# Patient Record
Sex: Female | Born: 1975
Health system: Southern US, Community
[De-identification: ages and names within clinical notes are randomized; demographics above are authoritative.]

## PROBLEM LIST (undated history)

## (undated) DIAGNOSIS — E079 Disorder of thyroid, unspecified: Secondary | ICD-10-CM

## (undated) DIAGNOSIS — L719 Rosacea, unspecified: Secondary | ICD-10-CM

## (undated) DIAGNOSIS — Z8619 Personal history of other infectious and parasitic diseases: Secondary | ICD-10-CM

## (undated) DIAGNOSIS — E785 Hyperlipidemia, unspecified: Secondary | ICD-10-CM

## (undated) DIAGNOSIS — O24419 Gestational diabetes mellitus in pregnancy, unspecified control: Secondary | ICD-10-CM

## (undated) DIAGNOSIS — R87629 Unspecified abnormal cytological findings in specimens from vagina: Secondary | ICD-10-CM

## (undated) DIAGNOSIS — E039 Hypothyroidism, unspecified: Secondary | ICD-10-CM

## (undated) HISTORY — PX: WISDOM TOOTH EXTRACTION: SHX21

## (undated) HISTORY — DX: Gestational diabetes mellitus in pregnancy, unspecified control: O24.419

## (undated) HISTORY — DX: Hypothyroidism, unspecified: E03.9

## (undated) HISTORY — DX: Disorder of thyroid, unspecified: E07.9

## (undated) HISTORY — DX: Hyperlipidemia, unspecified: E78.5

## (undated) HISTORY — DX: Unspecified abnormal cytological findings in specimens from vagina: R87.629

## (undated) HISTORY — DX: Personal history of other infectious and parasitic diseases: Z86.19

## (undated) HISTORY — DX: Rosacea, unspecified: L71.9

---

## 2012-01-06 ENCOUNTER — Ambulatory Visit (INDEPENDENT_AMBULATORY_CARE_PROVIDER_SITE_OTHER): Payer: BC Managed Care – PPO | Admitting: Family Medicine

## 2012-01-06 ENCOUNTER — Encounter: Payer: Self-pay | Admitting: Family Medicine

## 2012-01-06 VITALS — BP 122/90 | HR 82 | Resp 16 | Ht 70.0 in | Wt 264.0 lb

## 2012-01-06 DIAGNOSIS — L0292 Furuncle, unspecified: Secondary | ICD-10-CM

## 2012-01-06 DIAGNOSIS — E669 Obesity, unspecified: Secondary | ICD-10-CM

## 2012-01-06 DIAGNOSIS — L719 Rosacea, unspecified: Secondary | ICD-10-CM

## 2012-01-06 DIAGNOSIS — E039 Hypothyroidism, unspecified: Secondary | ICD-10-CM

## 2012-01-06 MED ORDER — SULFAMETHOXAZOLE-TRIMETHOPRIM 800-160 MG PO TABS: 1.0000 | ORAL_TABLET | Freq: Two times a day (BID) | ORAL | Status: AC

## 2012-01-06 MED ORDER — LEVOTHYROXINE SODIUM 150 MCG PO TABS: 150.0000 ug | ORAL_TABLET | Freq: Every day | ORAL | Status: DC

## 2012-01-06 MED ORDER — NORGESTIM-ETH ESTRAD TRIPHASIC 0.18/0.215/0.25 MG-35 MCG PO TABS: 1.0000 | ORAL_TABLET | Freq: Every day | ORAL | Status: DC

## 2012-01-06 MED ORDER — AMOXICILLIN 500 MG PO CAPS: 500.0000 mg | ORAL_CAPSULE | Freq: Two times a day (BID) | ORAL | Status: DC

## 2012-01-06 NOTE — Patient Instructions (Addendum)
Continue current medications Hold Amoxicillin Start Bactrim twice a day for infection, warm compresses Get the labs done fasting  I will get records  F/U 4 weeks- give 8AM slot

## 2012-01-08 DIAGNOSIS — E039 Hypothyroidism, unspecified: Secondary | ICD-10-CM | POA: Insufficient documentation

## 2012-01-08 DIAGNOSIS — L0292 Furuncle, unspecified: Secondary | ICD-10-CM | POA: Insufficient documentation

## 2012-01-08 DIAGNOSIS — E669 Obesity, unspecified: Secondary | ICD-10-CM | POA: Insufficient documentation

## 2012-01-08 DIAGNOSIS — L719 Rosacea, unspecified: Secondary | ICD-10-CM | POA: Insufficient documentation

## 2012-01-08 NOTE — Assessment & Plan Note (Signed)
Treat with Bactrim, dial soap, try another form of hair removal since lesions have increased with shaving

## 2012-01-08 NOTE — Assessment & Plan Note (Signed)
She is to take synthroid first thing in the morning before other meds, labs to be done

## 2012-01-08 NOTE — Progress Notes (Signed)
  Subjective:    Patient ID: Charlotte Carter, female    DOB: 10/21/1975, 36 y.o.   MRN: [id number]  HPI Pt here to establish care, she moved to [place] a few months ago. Records to be obtained from previous PCP. Medications and history reviewed PAP Smear UTD Works as OT for [employer] Needs refill on birth control pill Tri-sprintec Has recurrent cysts on her mons pubis, she typicaly shaves has had these evaluated before, some leaking pus.  Taking amoxicillin for Rosacea Hypothyroidism- has been on thyroid replacement since childhood, unaware of specific disease   Review of Systems  GEN- denies fatigue, fever, weight loss,weakness, recent illness HEENT- denies eye drainage, change in vision, nasal discharge, CVS- denies chest pain, palpitations RESP- denies SOB, cough, wheeze ABD- denies N/V, change in stools, abd pain GU- denies dysuria, hematuria, dribbling, incontinence MSK- denies joint pain, muscle aches, injury Neuro- denies headache, dizziness, syncope, seizure activity      Objective:   Physical Exam GEN- NAD, alert and oriented x3, obese HEENT- PERRL, EOMI, non injected sclera, pink conjunctiva, MMM, oropharynx clear Neck- Supple,  CVS- RRR, no murmur RESP-CTAB ABD-NABS,soft,NT,ND EXT- No edema Pulses- Radial, DP- 2+ Skin- erythema to chest wall and face, neck, small boils and ingrown hairs on Mons Pubis, +induration, few open lesions with mild drainage       Assessment & Plan:

## 2012-01-08 NOTE — Assessment & Plan Note (Signed)
SHe is on amox for rosacea, will stop while on bactrim, she has not seen much benefit from chronic antibiotic use, will discuss possibly stopping

## 2012-01-08 NOTE — Assessment & Plan Note (Signed)
Check FLP 

## 2012-01-28 LAB — LIPID PANEL
Cholesterol: 171 mg/dL (ref 0–200)
HDL: 28 mg/dL — ABNORMAL LOW (ref >39)
LDL Cholesterol: 105 mg/dL — ABNORMAL HIGH (ref 0–99)
Triglycerides: 191 mg/dL — ABNORMAL HIGH (ref <150)

## 2012-01-28 LAB — COMPREHENSIVE METABOLIC PANEL
AST: 27 U/L (ref 0–37)
Alkaline Phosphatase: 40 U/L (ref 39–117)
BUN: 9 mg/dL (ref 6–23)
Calcium: 9.7 mg/dL (ref 8.4–10.5)
Creat: 0.58 mg/dL (ref 0.50–1.10)

## 2012-01-28 LAB — CBC
HCT: 42.6 % (ref 36.0–46.0)
Hemoglobin: 14.6 g/dL (ref 12.0–15.0)
MCH: 30.4 pg (ref 26.0–34.0)
MCHC: 34.3 g/dL (ref 30.0–36.0)
RBC: 4.8 MIL/uL (ref 3.87–5.11)

## 2012-01-29 LAB — T3, FREE: T3, Free: 3.3 pg/mL (ref 2.3–4.2)

## 2012-01-29 LAB — T4: T4, Total: 13.1 ug/dL — ABNORMAL HIGH (ref 5.0–12.5)

## 2012-02-03 ENCOUNTER — Ambulatory Visit (INDEPENDENT_AMBULATORY_CARE_PROVIDER_SITE_OTHER): Payer: BC Managed Care – PPO | Admitting: Family Medicine

## 2012-02-03 ENCOUNTER — Encounter: Payer: Self-pay | Admitting: Family Medicine

## 2012-02-03 ENCOUNTER — Other Ambulatory Visit (HOSPITAL_COMMUNITY)
Admission: RE | Admit: 2012-02-03 | Discharge: 2012-02-03 | Disposition: A | Discharge status: Home / Self Care | Payer: BC Managed Care – PPO | Source: Ambulatory Visit | Attending: Family Medicine | Admitting: Family Medicine

## 2012-02-03 VITALS — BP 120/88 | HR 87 | Resp 15 | Ht 70.0 in | Wt 262.1 lb

## 2012-02-03 DIAGNOSIS — L723 Sebaceous cyst: Secondary | ICD-10-CM

## 2012-02-03 DIAGNOSIS — Z1151 Encounter for screening for human papillomavirus (HPV): Secondary | ICD-10-CM | POA: Insufficient documentation

## 2012-02-03 DIAGNOSIS — Z01419 Encounter for gynecological examination (general) (routine) without abnormal findings: Secondary | ICD-10-CM

## 2012-02-03 DIAGNOSIS — Z124 Encounter for screening for malignant neoplasm of cervix: Secondary | ICD-10-CM

## 2012-02-03 DIAGNOSIS — L720 Epidermal cyst: Secondary | ICD-10-CM

## 2012-02-03 DIAGNOSIS — L0292 Furuncle, unspecified: Secondary | ICD-10-CM

## 2012-02-03 DIAGNOSIS — E785 Hyperlipidemia, unspecified: Secondary | ICD-10-CM

## 2012-02-03 DIAGNOSIS — R7301 Impaired fasting glucose: Secondary | ICD-10-CM | POA: Insufficient documentation

## 2012-02-03 MED ORDER — SULFAMETHOXAZOLE-TRIMETHOPRIM 800-160 MG PO TABS: 1.0000 | ORAL_TABLET | Freq: Two times a day (BID) | ORAL | Status: AC

## 2012-02-03 NOTE — Assessment & Plan Note (Signed)
Refer to general surgery for removal 

## 2012-02-03 NOTE — Progress Notes (Signed)
  Subjective:    Patient ID: Charlotte Carter, female    DOB: 05-16-1975, 36 y.o.   MRN: [id number]  HPI Pt here for GYN exam, Boils have improved but still present, one on her right panty line gets irritated and drains clear fluid at times but has improved a lot. Has a spot on her back for the past 3-4 years that has been growing in size and irritating her Had a knot on her right ankle that was swollen after dancing now improved.    Review of Systems  GEN- denies fatigue, fever, weight loss,weakness, recent illness HEENT- denies eye drainage, change in vision, nasal discharge, CVS- denies chest pain, palpitations RESP- denies SOB, cough, wheeze ABD- denies N/V, change in stools, abd pain GU- denies dysuria, hematuria, dribbling, incontinence MSK- denies joint pain, muscle aches, injury Neuro- denies headache, dizziness, syncope, seizure activity      Objective:   Physical Exam GEN- NAD, alert and oriented x3, obese HEENT- PERRL, EOMI, non injected sclera, pink conjunctiva, MMM, oropharynx clear CVS- RRR, no murmur RESP-CTAB Breast- normal symmetry, no nipple inversion,no nipple drainage, no nodules or lumps felt Nodes- no axillary nodes GU- normal external genitalia, vaginal mucosa pink and moist, cervix visualized no growth, no blood form os, minimal thin clear discharge, no CMT, no ovarian masses, uterus normal size, mons pubis small area of erythema, with mild induration, non fluctuant, small boil on right inner leg- improved, minimal clear drainage, no erythema, no induration, no fluctuance Back- 2cm cystic lesion in center of back , no erythema EXT- no nodule seen on right ankle, no swelling, normal ROM ankle joint, no edema       Assessment & Plan:   GYN exam- PAP Smear done, flu shot done already

## 2012-02-03 NOTE — Assessment & Plan Note (Signed)
Concern for DM with her recurrent infections and elevated CBG, obtain A1C

## 2012-02-03 NOTE — Assessment & Plan Note (Signed)
Lesions much improved today, the lesion in question on her pubis has no fluctuance or discrete abscess to I &D, as improved with bactrim will have her do another course, if they do not improve send to dermatology

## 2012-02-03 NOTE — Assessment & Plan Note (Signed)
Elevated TG, increase Fish oil to BID

## 2012-02-03 NOTE — Patient Instructions (Addendum)
Increase fish oil to 1 tablet twice a day Second course of bactrim Warm compresses Referral to general surgery for your cyst on back I recommend eye visit once a year I recommend dental visit every 6 months Goal is to  Exercise 30 minutes 5 days a week We will send a letter with lab results  F/U 6 months

## 2012-02-21 ENCOUNTER — Telehealth: Payer: Self-pay | Admitting: Family Medicine

## 2012-02-21 NOTE — Telephone Encounter (Signed)
Pt had CPE on 2nd visit

## 2012-02-21 NOTE — Telephone Encounter (Signed)
Error - disregard - message resent °

## 2012-02-21 NOTE — Telephone Encounter (Signed)
Error - disregard - message resent

## 2012-06-26 ENCOUNTER — Encounter: Payer: Self-pay | Admitting: Family Medicine

## 2012-06-26 ENCOUNTER — Ambulatory Visit (INDEPENDENT_AMBULATORY_CARE_PROVIDER_SITE_OTHER): Payer: BC Managed Care – PPO | Admitting: Family Medicine

## 2012-06-26 ENCOUNTER — Other Ambulatory Visit (HOSPITAL_COMMUNITY)
Admission: RE | Admit: 2012-06-26 | Discharge: 2012-06-26 | Disposition: A | Discharge status: Home / Self Care | Payer: BC Managed Care – PPO | Source: Ambulatory Visit | Attending: Family Medicine | Admitting: Family Medicine

## 2012-06-26 VITALS — BP 120/88 | HR 81 | Resp 16 | Wt 269.0 lb

## 2012-06-26 DIAGNOSIS — Z113 Encounter for screening for infections with a predominantly sexual mode of transmission: Secondary | ICD-10-CM | POA: Insufficient documentation

## 2012-06-26 DIAGNOSIS — N760 Acute vaginitis: Secondary | ICD-10-CM

## 2012-06-26 DIAGNOSIS — E669 Obesity, unspecified: Secondary | ICD-10-CM

## 2012-06-26 DIAGNOSIS — L720 Epidermal cyst: Secondary | ICD-10-CM

## 2012-06-26 DIAGNOSIS — L723 Sebaceous cyst: Secondary | ICD-10-CM

## 2012-06-26 DIAGNOSIS — K130 Diseases of lips: Secondary | ICD-10-CM

## 2012-06-26 MED ORDER — FLUCONAZOLE 150 MG PO TABS: 150.0000 mg | ORAL_TABLET | Freq: Once | ORAL | Status: DC

## 2012-06-26 MED ORDER — PHENTERMINE HCL 30 MG PO TBDP: 1.0000 | ORAL_TABLET | Freq: Every day | ORAL | Status: DC

## 2012-06-26 MED ORDER — CLOTRIMAZOLE-BETAMETHASONE 1-0.05 % EX CREA: TOPICAL_CREAM | CUTANEOUS | Status: DC

## 2012-06-26 NOTE — Patient Instructions (Addendum)
Take diflucan tablet Use fungal/steroid cream on external vaginal area and groin only Stop the neosporin Call if this does not improve Phentermine- if you start take 1/2 tablet daily, for 2 weeks then increase to 1 tablet  F/U 6 months

## 2012-06-26 NOTE — Progress Notes (Signed)
  Subjective:    Patient ID: Charlotte Carter, female    DOB: [birth year], 37 y.o.   MRN: [id number]  HPI  Patient here to follow chronic medical problems. She also has a rash in genital region for the past 2 weeks it is red and itchy in nature. She has been using over-the-counter been taking vaginal cream as well as Monistat she thought she had a yeast infection with some vaginal discharge and itching. She has systems on her back however her husband pop this for her because they could not afford the surgery. Having difficulty loosing weight, has tried dieting and changing habits, works out 30 minutes 3 days a week Review of Systems  GEN- denies fatigue, fever, weight loss,weakness, recent illness HEENT- denies eye drainage, change in vision, nasal discharge, CVS- denies chest pain, palpitations RESP- denies SOB, cough, wheeze ABD- denies N/V, change in stools, abd pain GU- denies dysuria, hematuria, dribbling, incontinence MSK- denies joint pain, muscle aches, injury Neuro- denies headache, dizziness, syncope, seizure activity      Objective:   Physical Exam GEN- NAD, alert and oriented x3, obese HEENT- PERRL, EOMI, non injected sclera, pink conjunctiva, MMM, oropharynx clear CVS- RRR, no murmur RESP-CTAB Breast- normal symmetry, no nipple inversion,no nipple drainage, no nodules or lumps felt Nodes- no axillary nodes GU- normal external genitalia, vaginal mucosa pink and moist, cervix visualized no growth, no blood form os, + discharge, no CMT, right groin erythema slight raised, no warmth, few small pustules scattered on buttocks, erythema extends to labia majora      Assessment & Plan:

## 2012-06-27 DIAGNOSIS — K130 Diseases of lips: Secondary | ICD-10-CM | POA: Insufficient documentation

## 2012-06-27 DIAGNOSIS — N760 Acute vaginitis: Secondary | ICD-10-CM | POA: Insufficient documentation

## 2012-06-27 NOTE — Assessment & Plan Note (Signed)
Appears to be inflamed candidal infection, will use lotrisone BID,

## 2012-06-27 NOTE — Assessment & Plan Note (Signed)
Pt let husband drain this, there is still a small pin-hole, no signs of infection, will likley re-occur

## 2012-06-27 NOTE — Assessment & Plan Note (Signed)
Cultures taken, diflucan given

## 2012-06-27 NOTE — Assessment & Plan Note (Signed)
Discussed proper nutrition, activity of 150 minutes per week, she will think about trying phentermine depending on if she can afford medication

## 2012-07-20 ENCOUNTER — Ambulatory Visit: Payer: BC Managed Care – PPO | Admitting: Family Medicine

## 2012-08-18 ENCOUNTER — Encounter: Payer: Self-pay | Admitting: Family Medicine

## 2012-08-20 ENCOUNTER — Telehealth: Payer: Self-pay | Admitting: Family Medicine

## 2012-08-20 NOTE — Telephone Encounter (Signed)
Please call patient pharmacy and get the doses of these acne medications she was and go ahead and refill  Retin A Metro-gel

## 2012-08-21 ENCOUNTER — Other Ambulatory Visit: Payer: Self-pay

## 2012-08-21 NOTE — Telephone Encounter (Signed)
Pt contacted to give [place] pharmacy information and dosing for her current meds as multiple doses for these creams

## 2012-08-23 ENCOUNTER — Other Ambulatory Visit: Payer: Self-pay | Admitting: Family Medicine

## 2012-08-23 MED ORDER — METRONIDAZOLE 0.75 % EX GEL: Freq: Every day | CUTANEOUS | Status: DC

## 2012-08-23 MED ORDER — TRETINOIN 0.025 % EX GEL: Freq: Every day | CUTANEOUS | Status: DC

## 2012-09-11 ENCOUNTER — Telehealth: Payer: Self-pay | Admitting: Family Medicine

## 2012-09-11 ENCOUNTER — Other Ambulatory Visit: Payer: Self-pay

## 2012-09-11 MED ORDER — TRETINOIN 0.025 % EX GEL: Freq: Every day | CUTANEOUS | Status: DC

## 2012-09-11 NOTE — Telephone Encounter (Signed)
Never received a PA request. Sent to [facility] in [place] and if needs a pa they will fax request

## 2012-09-12 ENCOUNTER — Encounter: Payer: Self-pay | Admitting: Family Medicine

## 2012-09-13 ENCOUNTER — Other Ambulatory Visit: Payer: Self-pay

## 2012-09-13 MED ORDER — NORGESTIM-ETH ESTRAD TRIPHASIC 0.18/0.215/0.25 MG-35 MCG PO TABS: 1.0000 | ORAL_TABLET | Freq: Every day | ORAL | Status: DC

## 2012-09-13 MED ORDER — LEVOTHYROXINE SODIUM 150 MCG PO TABS: 150.0000 ug | ORAL_TABLET | Freq: Every day | ORAL | Status: DC

## 2012-10-04 ENCOUNTER — Encounter: Payer: Self-pay | Admitting: Family Medicine

## 2012-11-09 ENCOUNTER — Ambulatory Visit (INDEPENDENT_AMBULATORY_CARE_PROVIDER_SITE_OTHER): Payer: BC Managed Care – PPO | Admitting: Family Medicine

## 2012-11-09 ENCOUNTER — Encounter: Payer: Self-pay | Admitting: Family Medicine

## 2012-11-09 VITALS — BP 128/98 | HR 76 | Temp 98.6°F | Resp 20 | Ht 69.0 in | Wt 267.0 lb

## 2012-11-09 DIAGNOSIS — J069 Acute upper respiratory infection, unspecified: Secondary | ICD-10-CM

## 2012-11-09 MED ORDER — AZITHROMYCIN 250 MG PO TABS: ORAL_TABLET | ORAL | Status: DC

## 2012-11-09 MED ORDER — GUAIFENESIN-CODEINE 100-10 MG/5ML PO SYRP: 10.0000 mL | ORAL_SOLUTION | Freq: Three times a day (TID) | ORAL | Status: DC | PRN

## 2012-11-09 MED ORDER — PHENTERMINE HCL 30 MG PO TBDP: 1.0000 | ORAL_TABLET | Freq: Every day | ORAL | Status: DC

## 2012-11-09 NOTE — Patient Instructions (Signed)
Take the antibiotics as prescribed Use the robitussin with codiene Gargle warm salt water or use throat lozenge F/U 3 months for weight- Fasting labs will be done that day

## 2012-11-11 DIAGNOSIS — J069 Acute upper respiratory infection, unspecified: Secondary | ICD-10-CM | POA: Insufficient documentation

## 2012-11-11 NOTE — Assessment & Plan Note (Signed)
With recent travel will treat with zpak, cough medication, fluids RTC if not improved, obtain CXR

## 2012-11-11 NOTE — Progress Notes (Signed)
  Subjective:    Patient ID: Charlotte Carter, female    DOB: 1975-08-01, 37 y.o.   MRN: [id number]  HPI  Pt here with mild productive cough, sore throat, nasal congestion  For the past week. [place] when symptoms started. Denies any fever, chills, N/V. She has been taking some OTC meds without relief. Continues to smoke but has cut back +sick contact    Review of Systems   GEN- + fatigue, denies fever, weight loss,weakness, recent illness HEENT- denies eye drainage, change in vision,+ nasal discharge, CVS- denies chest pain, palpitations RESP- denies SOB,+ cough, wheeze ABD- denies N/V, change in stools, abd pain Neuro- denies headache, dizziness, syncope, seizure activity      Objective:   Physical Exam  GEN- NAD, alert and oriented x3, +hoarse voice HEENT- PERRL, EOMI, non injected sclera, pink conjunctiva, MMM, oropharynx mild injection, TM clear bilat no effusion, no maxillary sinus tenderness, clear Nasal drainage  Neck- Supple, no LAD CVS- RRR, no murmur RESP-CTAB Pulses- Radial 2+         Assessment & Plan:

## 2012-11-21 ENCOUNTER — Encounter: Payer: Self-pay | Admitting: Family Medicine

## 2012-11-25 ENCOUNTER — Encounter: Payer: Self-pay | Admitting: Family Medicine

## 2012-11-26 ENCOUNTER — Other Ambulatory Visit: Payer: Self-pay | Admitting: Family Medicine

## 2012-11-26 MED ORDER — PHENTERMINE HCL 30 MG PO CAPS: 30.0000 mg | ORAL_CAPSULE | ORAL | Status: DC

## 2012-11-29 ENCOUNTER — Encounter: Payer: Self-pay | Admitting: Family Medicine

## 2012-12-05 ENCOUNTER — Other Ambulatory Visit: Payer: Self-pay | Admitting: Family Medicine

## 2012-12-05 ENCOUNTER — Encounter: Payer: Self-pay | Admitting: Family Medicine

## 2012-12-05 MED ORDER — PHENTERMINE HCL 37.5 MG PO TABS: 37.5000 mg | ORAL_TABLET | Freq: Every day | ORAL | Status: DC

## 2012-12-05 NOTE — Telephone Encounter (Signed)
Rx Refilled  

## 2013-02-08 ENCOUNTER — Ambulatory Visit: Payer: BC Managed Care – PPO | Admitting: Family Medicine

## 2013-02-15 ENCOUNTER — Ambulatory Visit: Payer: BC Managed Care – PPO | Admitting: Family Medicine

## 2013-02-18 ENCOUNTER — Encounter: Payer: Self-pay | Admitting: Family Medicine

## 2013-02-18 ENCOUNTER — Ambulatory Visit: Payer: BC Managed Care – PPO | Admitting: Family Medicine

## 2013-02-19 ENCOUNTER — Other Ambulatory Visit: Payer: BC Managed Care – PPO | Admitting: Family Medicine

## 2013-02-20 ENCOUNTER — Other Ambulatory Visit: Payer: Self-pay | Admitting: Family Medicine

## 2013-02-20 DIAGNOSIS — E039 Hypothyroidism, unspecified: Secondary | ICD-10-CM

## 2013-02-20 DIAGNOSIS — E785 Hyperlipidemia, unspecified: Secondary | ICD-10-CM

## 2013-02-20 DIAGNOSIS — R7301 Impaired fasting glucose: Secondary | ICD-10-CM

## 2013-02-21 ENCOUNTER — Other Ambulatory Visit: Payer: Self-pay

## 2013-02-21 ENCOUNTER — Other Ambulatory Visit: Payer: Self-pay | Admitting: Family Medicine

## 2013-02-22 ENCOUNTER — Telehealth: Payer: Self-pay | Admitting: Family Medicine

## 2013-02-22 DIAGNOSIS — L719 Rosacea, unspecified: Secondary | ICD-10-CM

## 2013-02-22 MED ORDER — AMOXICILLIN 500 MG PO CAPS: 500.0000 mg | ORAL_CAPSULE | Freq: Two times a day (BID) | ORAL | Status: DC

## 2013-02-22 NOTE — Telephone Encounter (Signed)
Please refill, Amox 500mg  BID for rosacea , Okay to send in 90 day supply if insurance allows R2

## 2013-02-22 NOTE — Telephone Encounter (Signed)
Medication refilled per protocol. 

## 2013-02-22 NOTE — Telephone Encounter (Signed)
Request refill Amoxicillin 500 mg

## 2013-02-23 ENCOUNTER — Other Ambulatory Visit: Payer: Self-pay | Admitting: Family Medicine

## 2013-02-23 LAB — COMPREHENSIVE METABOLIC PANEL
AST: 22 U/L (ref 0–37)
Albumin: 3.8 g/dL (ref 3.5–5.2)
BUN: 11 mg/dL (ref 6–23)
CO2: 27 mEq/L (ref 19–32)
Calcium: 9.5 mg/dL (ref 8.4–10.5)
Chloride: 104 mEq/L (ref 96–112)
Glucose, Bld: 103 mg/dL — ABNORMAL HIGH (ref 70–99)
Potassium: 4.4 mEq/L (ref 3.5–5.3)
Total Bilirubin: 0.4 mg/dL (ref 0.3–1.2)

## 2013-02-23 LAB — TSH: TSH: 0.093 u[IU]/mL — ABNORMAL LOW (ref 0.350–4.500)

## 2013-02-23 LAB — CBC WITH DIFFERENTIAL/PLATELET
Basophils Absolute: 0 10*3/uL (ref 0.0–0.1)
HCT: 40.7 % (ref 36.0–46.0)
Hemoglobin: 14.1 g/dL (ref 12.0–15.0)
Lymphocytes Relative: 39 % (ref 12–46)
Lymphs Abs: 2 10*3/uL (ref 0.7–4.0)
Monocytes Absolute: 0.3 10*3/uL (ref 0.1–1.0)
Monocytes Relative: 6 % (ref 3–12)
Neutro Abs: 2.8 10*3/uL (ref 1.7–7.7)
Platelets: 300 10*3/uL (ref 150–400)
RBC: 4.83 MIL/uL (ref 3.87–5.11)
WBC: 5.3 10*3/uL (ref 4.0–10.5)

## 2013-02-23 LAB — LIPID PANEL
HDL: 33 mg/dL — ABNORMAL LOW (ref >39)
Total CHOL/HDL Ratio: 5.5 Ratio
VLDL: 24 mg/dL (ref 0–40)

## 2013-02-23 LAB — T4, FREE: Free T4: 1.8 ng/dL (ref 0.80–1.80)

## 2013-02-23 LAB — T3, FREE: T3, Free: 4.1 pg/mL (ref 2.3–4.2)

## 2013-02-24 LAB — HEMOGLOBIN A1C
Hgb A1c MFr Bld: 5.6 % (ref <5.7)
Mean Plasma Glucose: 114 mg/dL (ref <117)

## 2013-02-27 ENCOUNTER — Other Ambulatory Visit: Payer: Self-pay | Admitting: Family Medicine

## 2013-02-27 ENCOUNTER — Ambulatory Visit (INDEPENDENT_AMBULATORY_CARE_PROVIDER_SITE_OTHER): Payer: BC Managed Care – PPO | Admitting: Family Medicine

## 2013-02-27 VITALS — BP 110/70 | HR 98 | Temp 99.1°F | Resp 20 | Ht 69.0 in | Wt 257.0 lb

## 2013-02-27 DIAGNOSIS — E669 Obesity, unspecified: Secondary | ICD-10-CM

## 2013-02-27 DIAGNOSIS — L729 Follicular cyst of the skin and subcutaneous tissue, unspecified: Secondary | ICD-10-CM

## 2013-02-27 DIAGNOSIS — E039 Hypothyroidism, unspecified: Secondary | ICD-10-CM

## 2013-02-27 DIAGNOSIS — L918 Other hypertrophic disorders of the skin: Secondary | ICD-10-CM

## 2013-02-27 DIAGNOSIS — L909 Atrophic disorder of skin, unspecified: Secondary | ICD-10-CM

## 2013-02-27 DIAGNOSIS — E785 Hyperlipidemia, unspecified: Secondary | ICD-10-CM

## 2013-02-27 DIAGNOSIS — Z124 Encounter for screening for malignant neoplasm of cervix: Secondary | ICD-10-CM

## 2013-02-27 DIAGNOSIS — L723 Sebaceous cyst: Secondary | ICD-10-CM

## 2013-02-27 DIAGNOSIS — Z23 Encounter for immunization: Secondary | ICD-10-CM

## 2013-02-27 DIAGNOSIS — L719 Rosacea, unspecified: Secondary | ICD-10-CM

## 2013-02-27 MED ORDER — CLOTRIMAZOLE-BETAMETHASONE 1-0.05 % EX CREA: 1.0000 "application " | TOPICAL_CREAM | Freq: Two times a day (BID) | CUTANEOUS | Status: DC

## 2013-02-27 MED ORDER — LEVOTHYROXINE SODIUM 137 MCG PO TABS: 137.0000 ug | ORAL_TABLET | Freq: Every day | ORAL | Status: DC

## 2013-02-27 NOTE — Patient Instructions (Addendum)
I recommend eye visit once a year I recommend dental visit every 6 months Goal is to  Exercise 30 minutes 5 days a week We will send a letter with lab results  Get the labs done in 6 weeks in [place] for your Thyroid TDAP given Finish the phentermine and then continue to work on diet  F/U 6 months

## 2013-02-28 ENCOUNTER — Encounter: Payer: Self-pay | Admitting: Family Medicine

## 2013-02-28 DIAGNOSIS — L729 Follicular cyst of the skin and subcutaneous tissue, unspecified: Secondary | ICD-10-CM | POA: Insufficient documentation

## 2013-02-28 DIAGNOSIS — L918 Other hypertrophic disorders of the skin: Secondary | ICD-10-CM | POA: Insufficient documentation

## 2013-02-28 NOTE — Assessment & Plan Note (Signed)
Continue current meds, she is unable to afford dermatology at this time

## 2013-02-28 NOTE — Assessment & Plan Note (Signed)
Declines dermatology at this time for removal

## 2013-02-28 NOTE — Assessment & Plan Note (Signed)
Plan to d/c phentermine in 3 weeks, continue with diet and exercise

## 2013-02-28 NOTE — Assessment & Plan Note (Signed)
Decrease to , recheck in 6 weeks TSH

## 2013-02-28 NOTE — Progress Notes (Signed)
  Subjective:    Patient ID: Charlotte Carter, female    DOB: Aug 02, 1975, 37 y.o.   MRN: [id number]  HPI  Pt here to f/u CPE with PAP. Fasting labs reviewed, cholesterol has improved, thyroid levels show overtreatment. Skin tags beneath breast that are irritating her, very tiny would like to have removed, she also has a small cyst on her forehead which has been present of years, has not changed in size Obesity- has lost 10lbs  With phentermine, has tried to work on diet but this is still a problem for her, she does exercise 3-4 days a week.m She has taken med for about 2.5 months, often does not take every day, has 3 weeks left of meds.     Review of Systems  GEN- denies fatigue, fever, weight loss,weakness, recent illness HEENT- denies eye drainage, change in vision, nasal discharge, CVS- denies chest pain, palpitations RESP- denies SOB, cough, wheeze ABD- denies N/V, change in stools, abd pain GU- denies dysuria, hematuria, dribbling, incontinence MSK- denies joint pain, muscle aches, injury Neuro- denies headache, dizziness, syncope, seizure activity      Objective:   Physical Exam GEN- NAD, alert and oriented x3, obese HEENT- PERRL, EOMI, non injected sclera, pink conjunctiva, MMM, oropharynx clear Neck supple- no thyromegaly CVS- RRR, no murmur RESP-CTAB Breast- normal symmetry, no nipple inversion,no nipple drainage, no nodules or lumps felt Nodes- no axillary nodes ABD-NABS,soft,NT,ND GU- normal external genitalia, vaginal mucosa pink and moist, cervix visualized no growth, no blood form os, No discharge, no CMT, no ovarian masses, uterus normal size,  Skin- tiny skin tag beneath left and right breast, left anterior neck, small sebaceous cyst at peak of frontal hairline  EXT- No edema   Procedure- Skin Tag Removal Procedure explained to patient questions answered benefits and risks discussed verbal consent obtained. Antiseptic-ETOH Anesthesia-Cold spray Tags clipped  at base with scissors - 3 tags Minimal blood loss, silver nitrate touched to lesion on neck and axilla due to persistent oozing Patient tolerated procedure well Bandage applied      Assessment & Plan:   CPE with PAP- Labs reviewed, PAP done, if normal repeat in 2 year. TDAP given, flu UTD

## 2013-02-28 NOTE — Assessment & Plan Note (Signed)
benign skin tag removed with scissors at bedside

## 2013-02-28 NOTE — Assessment & Plan Note (Signed)
LDL up some, overall lipids look good continue with weight loss, needs to work on diet, which she is not fully committed to

## 2013-03-04 LAB — PAP THINPREP ASCUS RFLX HPV RFLX TYPE

## 2013-03-05 LAB — HPV DNA, RELFEX TYPE 16/18: HPV DNA High Risk: NOT DETECTED

## 2013-03-09 ENCOUNTER — Encounter: Payer: Self-pay | Admitting: Family Medicine

## 2013-03-13 ENCOUNTER — Telehealth: Payer: Self-pay | Admitting: Family Medicine

## 2013-03-13 NOTE — Telephone Encounter (Signed)
Called pt and aware of results 

## 2013-03-13 NOTE — Telephone Encounter (Signed)
Miamor is returning [place] call today Call back [phone]

## 2013-06-07 LAB — T3, FREE: T3, Free: 3 pg/mL (ref 2.3–4.2)

## 2013-06-07 LAB — TSH: TSH: 3.015 u[IU]/mL (ref 0.350–4.500)

## 2013-06-18 ENCOUNTER — Encounter: Payer: Self-pay | Admitting: Family Medicine

## 2013-06-19 ENCOUNTER — Other Ambulatory Visit: Payer: Self-pay | Admitting: *Deleted

## 2013-06-19 DIAGNOSIS — L719 Rosacea, unspecified: Secondary | ICD-10-CM

## 2013-06-19 MED ORDER — NORGESTIM-ETH ESTRAD TRIPHASIC 0.18/0.215/0.25 MG-35 MCG PO TABS: 1.0000 | ORAL_TABLET | Freq: Every day | ORAL | Status: DC

## 2013-06-19 MED ORDER — AMOXICILLIN 500 MG PO CAPS: 500.0000 mg | ORAL_CAPSULE | Freq: Two times a day (BID) | ORAL | Status: DC

## 2013-06-19 NOTE — Telephone Encounter (Signed)
Refill appropriate and filled per protocol. 

## 2013-08-13 ENCOUNTER — Encounter: Payer: Self-pay | Admitting: Family Medicine

## 2013-08-13 DIAGNOSIS — L719 Rosacea, unspecified: Secondary | ICD-10-CM

## 2013-08-13 MED ORDER — AMOXICILLIN 500 MG PO CAPS: 500.0000 mg | ORAL_CAPSULE | Freq: Two times a day (BID) | ORAL | Status: DC

## 2013-09-29 ENCOUNTER — Encounter: Payer: Self-pay | Admitting: Family Medicine

## 2013-10-01 ENCOUNTER — Encounter: Payer: Self-pay | Admitting: Family Medicine

## 2013-10-01 MED ORDER — CLOTRIMAZOLE-BETAMETHASONE 1-0.05 % EX CREA: 1.0000 "application " | TOPICAL_CREAM | Freq: Two times a day (BID) | CUTANEOUS | Status: DC

## 2013-10-01 MED ORDER — METRONIDAZOLE 0.75 % EX GEL: Freq: Every day | CUTANEOUS | Status: DC

## 2013-10-01 NOTE — Telephone Encounter (Signed)
Refill appropriate and filled per protocol. 

## 2013-10-01 NOTE — Addendum Note (Signed)
Addended by: Sheral Flow on: 10/01/2013 08:51 AM   Modules accepted: Orders

## 2014-01-08 ENCOUNTER — Ambulatory Visit (INDEPENDENT_AMBULATORY_CARE_PROVIDER_SITE_OTHER): Payer: BC Managed Care – PPO | Admitting: Family Medicine

## 2014-01-08 ENCOUNTER — Encounter: Payer: Self-pay | Admitting: Family Medicine

## 2014-01-08 VITALS — BP 134/90 | HR 84 | Temp 99.3°F | Resp 16 | Ht 71.0 in | Wt 278.0 lb

## 2014-01-08 DIAGNOSIS — J069 Acute upper respiratory infection, unspecified: Secondary | ICD-10-CM

## 2014-01-08 DIAGNOSIS — R591 Generalized enlarged lymph nodes: Secondary | ICD-10-CM

## 2014-01-08 DIAGNOSIS — J029 Acute pharyngitis, unspecified: Secondary | ICD-10-CM

## 2014-01-08 DIAGNOSIS — R599 Enlarged lymph nodes, unspecified: Secondary | ICD-10-CM

## 2014-01-08 LAB — RAPID STREP SCREEN (MED CTR MEBANE ONLY): STREPTOCOCCUS, GROUP A SCREEN (DIRECT): NEGATIVE

## 2014-01-08 MED ORDER — AMOXICILLIN-POT CLAVULANATE 875-125 MG PO TABS: 1.0000 | ORAL_TABLET | Freq: Two times a day (BID) | ORAL | Status: DC

## 2014-01-08 NOTE — Progress Notes (Signed)
Patient ID: Charlotte Carter, female   DOB: 29-Apr-1975, 38 y.o.   MRN: [id number]   Subjective:    Patient ID: Charlotte Carter, female    DOB: 04/28/1975, 38 y.o.   MRN: [id number]  Patient presents for Cough  patient here with sore throat that progressed with cough mild production and swollen lymph nodes. She's also had a low-grade fever. She did try taking over-the-counter medications with minimal improvement. She's been sick for a little over a week. She did try will loss they just recently. She does have amoxicillin secondary to her rosacea she had not been taking on a regular basis but did take a few tablets of last week when she was sick  Review Of Systems:  GEN- denies fatigue, +fever, weight loss,weakness, recent illness HEENT- denies eye drainage, change in vision, nasal discharge, CVS- denies chest pain, palpitations RESP- denies SOB, +cough, wheeze ABD- denies N/V, change in stools, abd pain Neuro- denies headache, dizziness, syncope, seizure activity       Objective:    BP 134/90  Pulse 84  Temp(Src) 99.3 F (37.4 C)  Resp 16 GEN- NAD, alert and oriented x3 HEENT- PERRL, EOMI, non injected sclera, pink conjunctiva, MMM, oropharynx mild injection, TM clear bilat no effusion, ,  maxillary sinus tenderness, inflammed turbinates,  Nasal drainage  Neck- Supple, + anterior LAD, anterior auricular LAD CVS- RRR, no murmur RESP-CTAB EXT- No edema Pulses- Radial 2+         Assessment & Plan:      Problem List Items Addressed This Visit   None    Visit Diagnoses   Sore throat    -  Primary    Relevant Orders       Rapid Strep Screen       Note: This dictation was prepared with Dragon dictation along with smaller phrase technology. Any transcriptional errors that result from this process are unintentional.

## 2014-01-08 NOTE — Patient Instructions (Signed)
Continue mucinex DM Hold the amoxicillin Take augmentin 1 tablet BID for 10 days Ibuprofen or tylenol for fever F/U as needed

## 2014-01-28 ENCOUNTER — Ambulatory Visit (INDEPENDENT_AMBULATORY_CARE_PROVIDER_SITE_OTHER): Payer: BC Managed Care – PPO

## 2014-01-28 ENCOUNTER — Ambulatory Visit (INDEPENDENT_AMBULATORY_CARE_PROVIDER_SITE_OTHER): Payer: BC Managed Care – PPO | Admitting: Family Medicine

## 2014-01-28 ENCOUNTER — Ambulatory Visit: Payer: BC Managed Care – PPO | Admitting: Family Medicine

## 2014-01-28 VITALS — BP 128/88 | HR 83 | Temp 98.8°F | Resp 18 | Ht 70.25 in | Wt 275.0 lb

## 2014-01-28 DIAGNOSIS — R079 Chest pain, unspecified: Secondary | ICD-10-CM

## 2014-01-28 LAB — POCT CBC
Granulocyte percent: 66.3 %G (ref 37–80)
HCT, POC: 41.7 % (ref 37.7–47.9)
Hemoglobin: 13.6 g/dL (ref 12.2–16.2)
Lymph, poc: 2.4 (ref 0.6–3.4)
MCH, POC: 29 pg (ref 27–31.2)
MCHC: 32.6 g/dL (ref 31.8–35.4)
MCV: 89 fL (ref 80–97)
MID (cbc): 0.6 (ref 0–0.9)
MPV: 7.5 fL (ref 0–99.8)
POC Granulocyte: 5.9 (ref 2–6.9)
POC LYMPH PERCENT: 27.1 %L (ref 10–50)
POC MID %: 6.6 %M (ref 0–12)
Platelet Count, POC: 272 10*3/uL (ref 142–424)
RBC: 4.69 M/uL (ref 4.04–5.48)
RDW, POC: 13.7 %
WBC: 8.9 10*3/uL (ref 4.6–10.2)

## 2014-01-28 LAB — POCT SEDIMENTATION RATE: POCT SED RATE: 25 mm/hr — AB (ref 0–22)

## 2014-01-28 MED ORDER — PREDNISONE 20 MG PO TABS: ORAL_TABLET | ORAL | Status: DC

## 2014-01-28 MED ORDER — HYDROCODONE-ACETAMINOPHEN 5-325 MG PO TABS: 1.0000 | ORAL_TABLET | Freq: Four times a day (QID) | ORAL | Status: DC | PRN

## 2014-01-28 NOTE — Progress Notes (Signed)
This chart was scribed for Robyn Haber, MD by Terressa Koyanagi, ED Scribe at [facility]. This patient was seen in room Room 2 and the patient's care was started at 11:30 AM.  Patient ID: Charlotte Carter MRN: [id number], DOB: 03-09-1976, 38 y.o. Date of Encounter: 01/28/2014, 11:30 AM  Primary Physician: Vic Blackbird, MD  Chief Complaint  Patient presents with  . Chest Pain    pt states she is very sore in center chest  . Shoulder Pain    pain sometimes radiates to her right shoulder  . Shortness of Breath    hurts to take a breath sometimes     HPI: 38 y.o. year old female with history below presents with intermittent, atraumatic, severe substernal chest pain with associated upper, right back pain onset a few days ago that is aggravated with deep breaths and movement. Pt reports having a cough a couple of weeks ago which has now fully resolved. Pt denies SOB.  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 Pt denies swelling of the LE, though pt notes right ankle swelling at baseline. Pt denies any other associated Sx. Pt reports a Hx of tobacco use and notes she smoked three weeks ago while on vacation. Pt denies regular tobacco use.     Past Medical History  Diagnosis Date  . Thyroid disease   . Rosacea   . Hyperlipidemia      Home Meds: Prior to Admission medications   Medication Sig Start Date End Date Taking? Authorizing Provider  levothyroxine (SYNTHROID, LEVOTHROID) 137 MCG tablet Take 1 tablet (137 mcg total) by mouth daily. 02/27/13  Yes Alycia Rossetti, MD  Multiple Vitamin (MULTIVITAMIN) tablet Take 1 tablet by mouth daily.   Yes Historical Provider, MD  Norgestimate-Ethinyl Estradiol Triphasic (TRI-SPRINTEC) 0.18/0.215/0.25 MG-35 MCG tablet Take 1 tablet by mouth daily. 06/19/13  Yes Alycia Rossetti, MD    Omega-3 Fatty Acids (FISH OIL) 1200 MG CAPS Take 1 capsule by mouth 2 (two) times daily.    Yes Historical Provider, MD  vitamin C (ASCORBIC ACID) 500 MG tablet Take 500 mg by mouth daily.   Yes Historical Provider, MD  amoxicillin (AMOXIL) 500 MG capsule Take 1 capsule (500 mg total) by mouth 2 (two) times daily. 08/13/13   Alycia Rossetti, MD  amoxicillin-clavulanate (AUGMENTIN) 875-125 MG per tablet Take 1 tablet by mouth 2 (two) times daily. 01/08/14   Alycia Rossetti, MD  clotrimazole-betamethasone (LOTRISONE) cream Apply 1 application topically 2 (two) times daily. 10/01/13   Alycia Rossetti, MD  metroNIDAZOLE (METROGEL) 0.75 % gel Apply topically daily. 10/01/13   Alycia Rossetti, MD    Allergies: No Known Allergies  History   Social History  . Marital Status: Unknown    Spouse Name: N/A    Number of Children: N/A  . Years of Education: N/A   Occupational History  . Not on file.   Social History Main Topics  . Smoking status: Former Research scientist (life sciences)  . Smokeless tobacco: Not on file  . Alcohol Use: 10.5 oz/week    21 drink(s) per week  . Drug Use: Not on file  . Sexual Activity: Not on file   Other Topics Concern  . Not on file   Social History Narrative  . No narrative on file     Review of Systems: Constitutional:  negative for chills, fever, night sweats, weight changes, or fatigue  HEENT: negative for vision changes, hearing loss, congestion, rhinorrhea, ST, epistaxis, or sinus pressure Cardiovascular: negative for chest pain or palpitations Respiratory: negative for hemoptysis, wheezing, shortness of breath, or cough Abdominal: negative for abdominal pain, nausea, vomiting, diarrhea, or constipation Dermatological: negative for rash Neurologic: negative for headache, dizziness, or syncope All other systems reviewed and are otherwise negative with the exception to those above and in the HPI. Musc: positive for substernal chest wall pain and right upper back  pain   Physical Exam: Blood pressure 128/88, pulse 83, temperature 98.8 F (37.1 C), temperature source Oral, resp. rate 18, height 5' 10.25" (1.784 m), weight 275 lb (124.739 kg), last menstrual period 01/22/2014, SpO2 98.00%., Body mass index is 39.19 kg/(m^2). General: Well developed, well nourished, in no acute distress. Head: Normocephalic, atraumatic, eyes without discharge, sclera non-icteric, nares are without discharge. Bilateral auditory canals clear, TM's are without perforation, pearly grey and translucent with reflective cone of light bilaterally. Oral cavity moist, posterior pharynx without exudate, erythema, peritonsillar abscess, or post nasal drip.  Neck: Supple. No thyromegaly. Full ROM. No lymphadenopathy. Lungs: Clear bilaterally to auscultation without wheezes, rales, or rhonchi. Breathing is unlabored. Heart: RRR with S1 S2. No murmurs, rubs, or gallops appreciated. Abdomen: Soft, non-tender, non-distended with normoactive bowel sounds. No hepatomegaly. No rebound/guarding. No obvious abdominal masses. Msk:  Strength and tone normal for age. Extremities/Skin: Warm and dry. No clubbing or cyanosis. No edema. No rashes or suspicious lesions. Neuro: Alert and oriented X 3. Moves all extremities spontaneously. Gait is normal. CNII-XII grossly in tact. Psych:  Responds to questions appropriately with a normal affect.   [facility] reading (PRIMARY) by  Dr. Joseph Art:  Neg CXR.   ASSESSMENT AND PLAN:  DIAGNOSTIC STUDIES: Oxygen Saturation is 98% on RA, nl by my interpretation.    COORDINATION OF CARE: 11:37 AM-Discussed treatment plan which includes imaging with pt at bedside and pt agreed to plan.   38 y.o. year old female with Chest pain, unspecified chest pain type - Plan: DG Chest 2 View, POCT CBC, POCT SEDIMENTATION RATE, predniSONE (DELTASONE) 20 MG tablet, HYDROcodone-acetaminophen (NORCO) 5-325 MG per tablet     Signed, Robyn Haber, MD 01/28/2014 11:30  AM   I personally performed the services described in this documentation, which was scribed in my presence. The recorded information has been reviewed and is accurate.

## 2014-01-28 NOTE — Patient Instructions (Signed)
Costochondritis Costochondritis, sometimes called Tietze syndrome, is a swelling and irritation (inflammation) of the tissue (cartilage) that connects your ribs with your breastbone (sternum). It causes pain in the chest and rib area. Costochondritis usually goes away on its own over time. It can take up to 6 weeks or longer to get better, especially if you are unable to limit your activities. CAUSES  Some cases of costochondritis have no known cause. Possible causes include:  Injury (trauma).  Exercise or activity such as lifting.  Severe coughing. SIGNS AND SYMPTOMS  Pain and tenderness in the chest and rib area.  Pain that gets worse when coughing or taking deep breaths.  Pain that gets worse with specific movements. DIAGNOSIS  Your health care provider will do a physical exam and ask about your symptoms. Chest X-rays or other tests may be done to rule out other problems. TREATMENT  Costochondritis usually goes away on its own over time. Your health care provider may prescribe medicine to help relieve pain. HOME CARE INSTRUCTIONS   Avoid exhausting physical activity. Try not to strain your ribs during normal activity. This would include any activities using chest, abdominal, and side muscles, especially if heavy weights are used.  Apply ice to the affected area for the first 2 days after the pain begins.  Put ice in a plastic bag.  Place a towel between your skin and the bag.  Leave the ice on for 20 minutes, 2-3 times a day.  Only take over-the-counter or prescription medicines as directed by your health care provider. SEEK MEDICAL CARE IF:  You have redness or swelling at the rib joints. These are signs of infection.  Your pain does not go away despite rest or medicine. SEEK IMMEDIATE MEDICAL CARE IF:   Your pain increases or you are very uncomfortable.  You have shortness of breath or difficulty breathing.  You cough up blood.  You have worse chest pains,  sweating, or vomiting.  You have a fever or persistent symptoms for more than 2-3 days.  You have a fever and your symptoms suddenly get worse. MAKE SURE YOU:   Understand these instructions.  Will watch your condition.  Will get help right away if you are not doing well or get worse. Document Released: 01/12/2005 Document Revised: 01/23/2013 Document Reviewed: 11/06/2012 Exit[employer] Patient Information 2015 [employer]. This information is not intended to replace advice given to you by your health care provider. Make sure you discuss any questions you have with your health care provider.  

## 2014-02-04 ENCOUNTER — Encounter: Payer: Self-pay | Admitting: Family Medicine

## 2014-02-10 ENCOUNTER — Encounter: Payer: Self-pay | Admitting: Family Medicine

## 2014-02-10 DIAGNOSIS — Z0000 Encounter for general adult medical examination without abnormal findings: Secondary | ICD-10-CM

## 2014-02-10 DIAGNOSIS — R7309 Other abnormal glucose: Secondary | ICD-10-CM

## 2014-02-10 DIAGNOSIS — E785 Hyperlipidemia, unspecified: Secondary | ICD-10-CM

## 2014-02-10 DIAGNOSIS — E039 Hypothyroidism, unspecified: Secondary | ICD-10-CM

## 2014-02-13 ENCOUNTER — Encounter: Payer: Self-pay | Admitting: Family Medicine

## 2014-02-13 MED ORDER — LEVOTHYROXINE SODIUM 137 MCG PO TABS: 137.0000 ug | ORAL_TABLET | Freq: Every day | ORAL | Status: DC

## 2014-02-21 ENCOUNTER — Other Ambulatory Visit: Payer: Self-pay | Admitting: Family Medicine

## 2014-02-21 LAB — CBC WITH DIFFERENTIAL/PLATELET
BASOS ABS: 0.1 10*3/uL (ref 0.0–0.1)
BASOS PCT: 1 % (ref 0–1)
EOS ABS: 0.2 10*3/uL (ref 0.0–0.7)
Eosinophils Relative: 3 % (ref 0–5)
HCT: 41.9 % (ref 36.0–46.0)
Hemoglobin: 13.7 g/dL (ref 12.0–15.0)
Lymphocytes Relative: 35 % (ref 12–46)
Lymphs Abs: 1.8 10*3/uL (ref 0.7–4.0)
MCH: 29 pg (ref 26.0–34.0)
MCHC: 32.7 g/dL (ref 30.0–36.0)
MCV: 88.6 fL (ref 78.0–100.0)
Monocytes Absolute: 0.3 10*3/uL (ref 0.1–1.0)
Monocytes Relative: 5 % (ref 3–12)
NEUTROS PCT: 56 % (ref 43–77)
Neutro Abs: 2.9 10*3/uL (ref 1.7–7.7)
PLATELETS: 270 10*3/uL (ref 150–400)
RBC: 4.73 MIL/uL (ref 3.87–5.11)
RDW: 13.8 % (ref 11.5–15.5)
WBC: 5.2 10*3/uL (ref 4.0–10.5)

## 2014-02-22 LAB — COMPLETE METABOLIC PANEL WITH GFR
ALT: 20 U/L (ref 0–35)
AST: 25 U/L (ref 0–37)
Albumin: 4.2 g/dL (ref 3.5–5.2)
Alkaline Phosphatase: 39 U/L (ref 39–117)
BILIRUBIN TOTAL: 0.5 mg/dL (ref 0.2–1.2)
BUN: 9 mg/dL (ref 6–23)
CALCIUM: 9.1 mg/dL (ref 8.4–10.5)
CHLORIDE: 101 meq/L (ref 96–112)
CO2: 23 meq/L (ref 19–32)
Creat: 0.63 mg/dL (ref 0.50–1.10)
GFR, Est Non African American: >89 mL/min
Glucose, Bld: 112 mg/dL — ABNORMAL HIGH (ref 70–99)
Potassium: 4.3 mEq/L (ref 3.5–5.3)
Sodium: 138 mEq/L (ref 135–145)
Total Protein: 6.8 g/dL (ref 6.0–8.3)

## 2014-02-22 LAB — LIPID PANEL
CHOLESTEROL: 183 mg/dL (ref 0–200)
HDL: 32 mg/dL — ABNORMAL LOW (ref >39)
LDL Cholesterol: 117 mg/dL — ABNORMAL HIGH (ref 0–99)
Total CHOL/HDL Ratio: 5.7 Ratio
Triglycerides: 171 mg/dL — ABNORMAL HIGH (ref <150)
VLDL: 34 mg/dL (ref 0–40)

## 2014-02-22 LAB — T3, FREE: T3, Free: 3.4 pg/mL (ref 2.3–4.2)

## 2014-02-22 LAB — HEMOGLOBIN A1C
HEMOGLOBIN A1C: 6.1 % — AB (ref <5.7)
Mean Plasma Glucose: 128 mg/dL — ABNORMAL HIGH (ref <117)

## 2014-02-22 LAB — T4, FREE: FREE T4: 1.49 ng/dL (ref 0.80–1.80)

## 2014-02-22 LAB — TSH: TSH: 2.138 u[IU]/mL (ref 0.350–4.500)

## 2014-03-11 ENCOUNTER — Other Ambulatory Visit: Payer: Self-pay | Admitting: Family Medicine

## 2014-03-11 ENCOUNTER — Encounter: Payer: Self-pay | Admitting: Family Medicine

## 2014-03-11 ENCOUNTER — Ambulatory Visit (INDEPENDENT_AMBULATORY_CARE_PROVIDER_SITE_OTHER): Payer: BC Managed Care – PPO | Admitting: Family Medicine

## 2014-03-11 VITALS — BP 110/70 | HR 68 | Temp 98.9°F | Resp 14 | Ht 70.0 in | Wt 274.0 lb

## 2014-03-11 DIAGNOSIS — E038 Other specified hypothyroidism: Secondary | ICD-10-CM

## 2014-03-11 DIAGNOSIS — Z0000 Encounter for general adult medical examination without abnormal findings: Secondary | ICD-10-CM

## 2014-03-11 DIAGNOSIS — R7303 Prediabetes: Secondary | ICD-10-CM

## 2014-03-11 DIAGNOSIS — Z124 Encounter for screening for malignant neoplasm of cervix: Secondary | ICD-10-CM

## 2014-03-11 DIAGNOSIS — L0292 Furuncle, unspecified: Secondary | ICD-10-CM

## 2014-03-11 DIAGNOSIS — R7309 Other abnormal glucose: Secondary | ICD-10-CM

## 2014-03-11 DIAGNOSIS — E785 Hyperlipidemia, unspecified: Secondary | ICD-10-CM

## 2014-03-11 DIAGNOSIS — E669 Obesity, unspecified: Secondary | ICD-10-CM

## 2014-03-11 MED ORDER — CLOTRIMAZOLE-BETAMETHASONE 1-0.05 % EX CREA: 1.0000 "application " | TOPICAL_CREAM | Freq: Two times a day (BID) | CUTANEOUS | Status: DC

## 2014-03-11 MED ORDER — MUPIROCIN 2 % EX OINT: 1.0000 "application " | TOPICAL_OINTMENT | Freq: Two times a day (BID) | CUTANEOUS | Status: DC

## 2014-03-11 MED ORDER — LEVOTHYROXINE SODIUM 137 MCG PO TABS
137.0000 ug | ORAL_TABLET | Freq: Every day | ORAL | Status: DC
Start: 2014-03-11 — End: 2014-12-04

## 2014-03-11 NOTE — Assessment & Plan Note (Signed)
We had a long discussion about dietary changes and weight loss.  I dont think she has fully committed to losing weight. We also discussed her desire to have a child, weight loss would help decrease her risks

## 2014-03-11 NOTE — Assessment & Plan Note (Signed)
TG elevated, dietary changes per above Recheck a1c IN 6 MONTHS

## 2014-03-11 NOTE — Assessment & Plan Note (Signed)
Some of lesions beneath axilla appear to be boils given topical bactroban

## 2014-03-11 NOTE — Patient Instructions (Signed)
I recommend eye visit once a year I recommend dental visit every 6 months Goal is to  Exercise 30 minutes 5 days a week We will send a letter with lab results  Work on weight loss, low carb diet Bactroban antibiotic cream as needed Lotrisone for more irritation  Labs in 6 months fasting F/U as needed or in 1 year

## 2014-03-11 NOTE — Assessment & Plan Note (Signed)
TFT normal, no change to dose

## 2014-03-11 NOTE — Progress Notes (Signed)
Patient ID: Charlotte Carter, female   DOB: [birth year], 38 y.o.   MRN: [id number]   Subjective:    Patient ID: Charlotte Carter, female    DOB: 1976-04-16, 38 y.o.   MRN: [id number]  Patient presents for CPE with PAP  She desires to have a child and we reviewed her medications to see if any had Category C or higher.  Last PAP Smear- Nov 2014 ASCUS with negative HPV Reviewed fasting labs A1C up to 6.1%, has gained some weight. Biometric form for her work completed.   She has noticed increase in small bumps beneath axilla, due to rubbing from her bra, mostly irritation but some look like small boils with pus in them.     Review Of Systems:  GEN- denies fatigue, fever, weight loss,weakness, recent illness HEENT- denies eye drainage, change in vision, nasal discharge, CVS- denies chest pain, palpitations RESP- denies SOB, cough, wheeze ABD- denies N/V, change in stools, abd pain GU- denies dysuria, hematuria, dribbling, incontinence MSK- denies joint pain, muscle aches, injury Neuro- denies headache, dizziness, syncope, seizure activity       Objective:    BP 110/70 mmHg  Pulse 68  Temp(Src) 98.9 F (37.2 C) (Oral)  Resp 14  Ht 5\' 10"  (1.778 m)  Wt 274 lb (124.286 kg)  BMI 39.32 kg/m2  LMP 02/24/2014 (Approximate) GEN- NAD, alert and oriented x3 HEENT- PERRL, EOMI, non injected sclera, pink conjunctiva, MMM, oropharynx clear, TM Clear bilat, no effusion Neck- Supple, no thyromegaly Breast- normal symmetry, no nipple inversion,no nipple drainage, no nodules or lumps felt Nodes- no axillary nodes CVS- RRR, no murmur RESP-CTAB ABD-NABS,soft,NT,ND GU- normal external genitalia, vaginal mucosa pink and moist, cervix visualized no growth- erythema to cervix, no blood form os, + clear discharge, no CMT, no ovarian masses, uterus normal size EXT- No edema Pulses- Radial 2+ Skin- few scattered erythematous maculopapular lesions benath axilla, no pustules, mild intertrigo beneath  breast, multiple scars from boils in groin region        Assessment & Plan:      Problem List Items Addressed This Visit    Obesity    We had a long discussion about dietary changes and weight loss.  I dont think she has fully committed to losing weight. We also discussed her desire to have a child, weight loss would help decrease her risks     Hypothyroidism    TFT normal, no change to dose    Relevant Medications      levothyroxine (SYNTHROID, LEVOTHROID) tablet   Hyperlipidemia    TG elevated, dietary changes per above Recheck a1c IN 6 MONTHS     Glucose intolerance (pre-diabetes)   Boils    Some of lesions beneath axilla appear to be boils given topical bactroban    Relevant Medications      BACTROBAN 2 % EX OINT      LOTRISONE 1-0.05 % EX CREA    Other Visit Diagnoses    Routine general medical examination at a health care facility    -  Primary    PAP Smear done, ASCUS on PAP 2014, immunizations UTD, reviewed fasting labs    Cervical cancer screening        PAP Smear    Relevant Orders       PAP, ThinPrep ASCUS Rflx HPV Rflx Type       Note: This dictation was prepared with Dragon dictation along with smaller phrase technology. Any transcriptional errors that result from this process are unintentional.

## 2014-03-12 LAB — PAP THINPREP ASCUS RFLX HPV RFLX TYPE

## 2014-03-17 ENCOUNTER — Telehealth: Payer: Self-pay | Admitting: Family Medicine

## 2014-03-17 LAB — HPV DNA, RELFEX TYPE 16/18: HPV DNA High Risk: NOT DETECTED

## 2014-03-17 NOTE — Telephone Encounter (Signed)
Call placed to patient. LMTRC.  

## 2014-03-17 NOTE — Telephone Encounter (Signed)
Who gave her amoxicllin as she was not sick on her recent visit Dose should be amox 500mg  TID, but this sounds like a viral illness and I would not treat with antibiotics at this time unless she has a positive strep test.  I am not sure what the actual question is?

## 2014-03-17 NOTE — Telephone Encounter (Signed)
[phone] Patient is calling to see if dr Buelah Manis can call in an rx for an uri she is having, the same thing she was seen for in September.

## 2014-03-17 NOTE — Telephone Encounter (Signed)
Patient is given Amoxicillin by Dermatology for Rosacea.   Patient made aware of MD recommendations.

## 2014-03-17 NOTE — Telephone Encounter (Signed)
Patient returned call.   States that she was exposed to ill children over holiday and now has the same s/sx the children had. Reports that she has non- productive cough with runny nose. States that nasal drainage is clear. Reports that she does have sone soreness to her throat, and her lymph nodes are swollen. Denies fever or chills.   Reports that she is taking Amoxicillin 1000mg  TID and she thinks that this is helping. Also states that she is taking Nyquil for symptom management.   Reports that MD gave Augmentin in Sept for enlarged lymph nodes.   MD please advise.

## 2014-04-27 ENCOUNTER — Encounter: Payer: Self-pay | Admitting: Family Medicine

## 2014-04-27 DIAGNOSIS — R87619 Unspecified abnormal cytological findings in specimens from cervix uteri: Secondary | ICD-10-CM

## 2014-04-28 ENCOUNTER — Telehealth: Payer: Self-pay | Admitting: Family Medicine

## 2014-04-28 NOTE — Telephone Encounter (Deleted)
When patient was in office last she showed me her cell phone that showed for DOS 9.23.15 --10.5.15 amount of $135.09 HRA 2016 [employer] ref #[id number] status released but not cleared payment method check. When I look on the website of [employer] it shows that the $135.09 is the patients deductible amount. Patient states her HRA has paid someone in the amount of $135.09.

## 2014-04-28 NOTE — Telephone Encounter (Signed)
Patient is calling because she says her hra paid for the balance that is in epic,  Please call her at [phone]

## 2014-04-28 NOTE — Telephone Encounter (Signed)
Patient called back states that [employer] phone number is [phone].

## 2014-04-28 NOTE — Telephone Encounter (Signed)
I called patient back she doesn't know for sure who her HRA is through she believes it is through health equity benefits. She states they are the ones that has paid [place] over the last year. She states that she wasn't at a place where she had internet service to look up the information. Our phone conversation was disconnected.

## 2014-09-12 LAB — OB RESULTS CONSOLE GC/CHLAMYDIA
CHLAMYDIA, DNA PROBE: NEGATIVE
Gonorrhea: NEGATIVE

## 2014-09-12 LAB — OB RESULTS CONSOLE RPR: RPR: NONREACTIVE

## 2014-09-12 LAB — OB RESULTS CONSOLE ANTIBODY SCREEN: Antibody Screen: NEGATIVE

## 2014-09-12 LAB — OB RESULTS CONSOLE HEPATITIS B SURFACE ANTIGEN: HEP B S AG: NEGATIVE

## 2014-09-12 LAB — OB RESULTS CONSOLE ABO/RH: RH TYPE: POSITIVE

## 2014-09-12 LAB — OB RESULTS CONSOLE HIV ANTIBODY (ROUTINE TESTING): HIV: NONREACTIVE

## 2014-09-12 LAB — OB RESULTS CONSOLE RUBELLA ANTIBODY, IGM: Rubella: IMMUNE

## 2014-09-24 ENCOUNTER — Telehealth: Payer: Self-pay | Admitting: Family Medicine

## 2014-09-24 MED ORDER — MEFLOQUINE HCL 250 MG PO TABS: 250.0000 mg | ORAL_TABLET | ORAL | Status: DC

## 2014-09-24 NOTE — Telephone Encounter (Signed)
Call pt she is traveling to [place], needs Malaria prophylaxis I reviewed CDC  Mefloquine is safe for her to take, Category B but she still needs to let her OB that she will be on this, may also need something for Nausea while traveling.  Take Mefoquine once a week, total 8 weeks    - Start 2 weeks before they leave, take while in [place] and take for 4 weeks upon return.

## 2014-09-24 NOTE — Telephone Encounter (Signed)
Call placed to patient and patient husband made aware.

## 2014-10-08 ENCOUNTER — Encounter: Payer: BLUE CROSS/BLUE SHIELD | Attending: Obstetrics & Gynecology

## 2014-10-08 VITALS — Ht 70.0 in | Wt 269.7 lb

## 2014-10-08 DIAGNOSIS — Z713 Dietary counseling and surveillance: Secondary | ICD-10-CM | POA: Insufficient documentation

## 2014-10-08 DIAGNOSIS — O24410 Gestational diabetes mellitus in pregnancy, diet controlled: Secondary | ICD-10-CM | POA: Insufficient documentation

## 2014-10-08 DIAGNOSIS — R7303 Prediabetes: Secondary | ICD-10-CM

## 2014-10-09 NOTE — Progress Notes (Signed)
  Patient was seen on 10/09/14 for Gestational Diabetes self-management class at the [facility]. The following learning objectives were met by the patient during this course:   States the definition of Gestational Diabetes  States why dietary management is important in controlling blood glucose  Describes the effects each nutrient has on blood glucose levels  Demonstrates ability to create a balanced meal plan  Demonstrates carbohydrate counting   States when to check blood glucose levels  Demonstrates proper blood glucose monitoring techniques  States the effect of stress and exercise on blood glucose levels  States the importance of limiting caffeine and abstaining from alcohol and smoking  Blood glucose monitor given: NO, she already has her own meter Blood glucose reading: 86 mg/dl  Patient instructed to monitor glucose levels: FBS: 60 - <90 1 hour: <140  *Patient received handouts:  Nutrition Diabetes and Pregnancy  Carbohydrate Counting List  Patient will be seen for follow-up as needed.

## 2014-12-03 ENCOUNTER — Encounter: Payer: Self-pay | Admitting: Family Medicine

## 2014-12-04 MED ORDER — LEVOTHYROXINE SODIUM 137 MCG PO TABS: 137.0000 ug | ORAL_TABLET | Freq: Every day | ORAL | Status: DC

## 2014-12-29 IMAGING — CR DG CHEST 2V
2 series · 2 of 2 positions shown · non-contrast
Comparison: None.

CLINICAL DATA: 38-year-old female with acute severe substernal
chest pain radiating to the right back. Pain aggravated by movement
and deep inspiration. Initial encounter.

EXAM:
CHEST  2 VIEW

[PA]
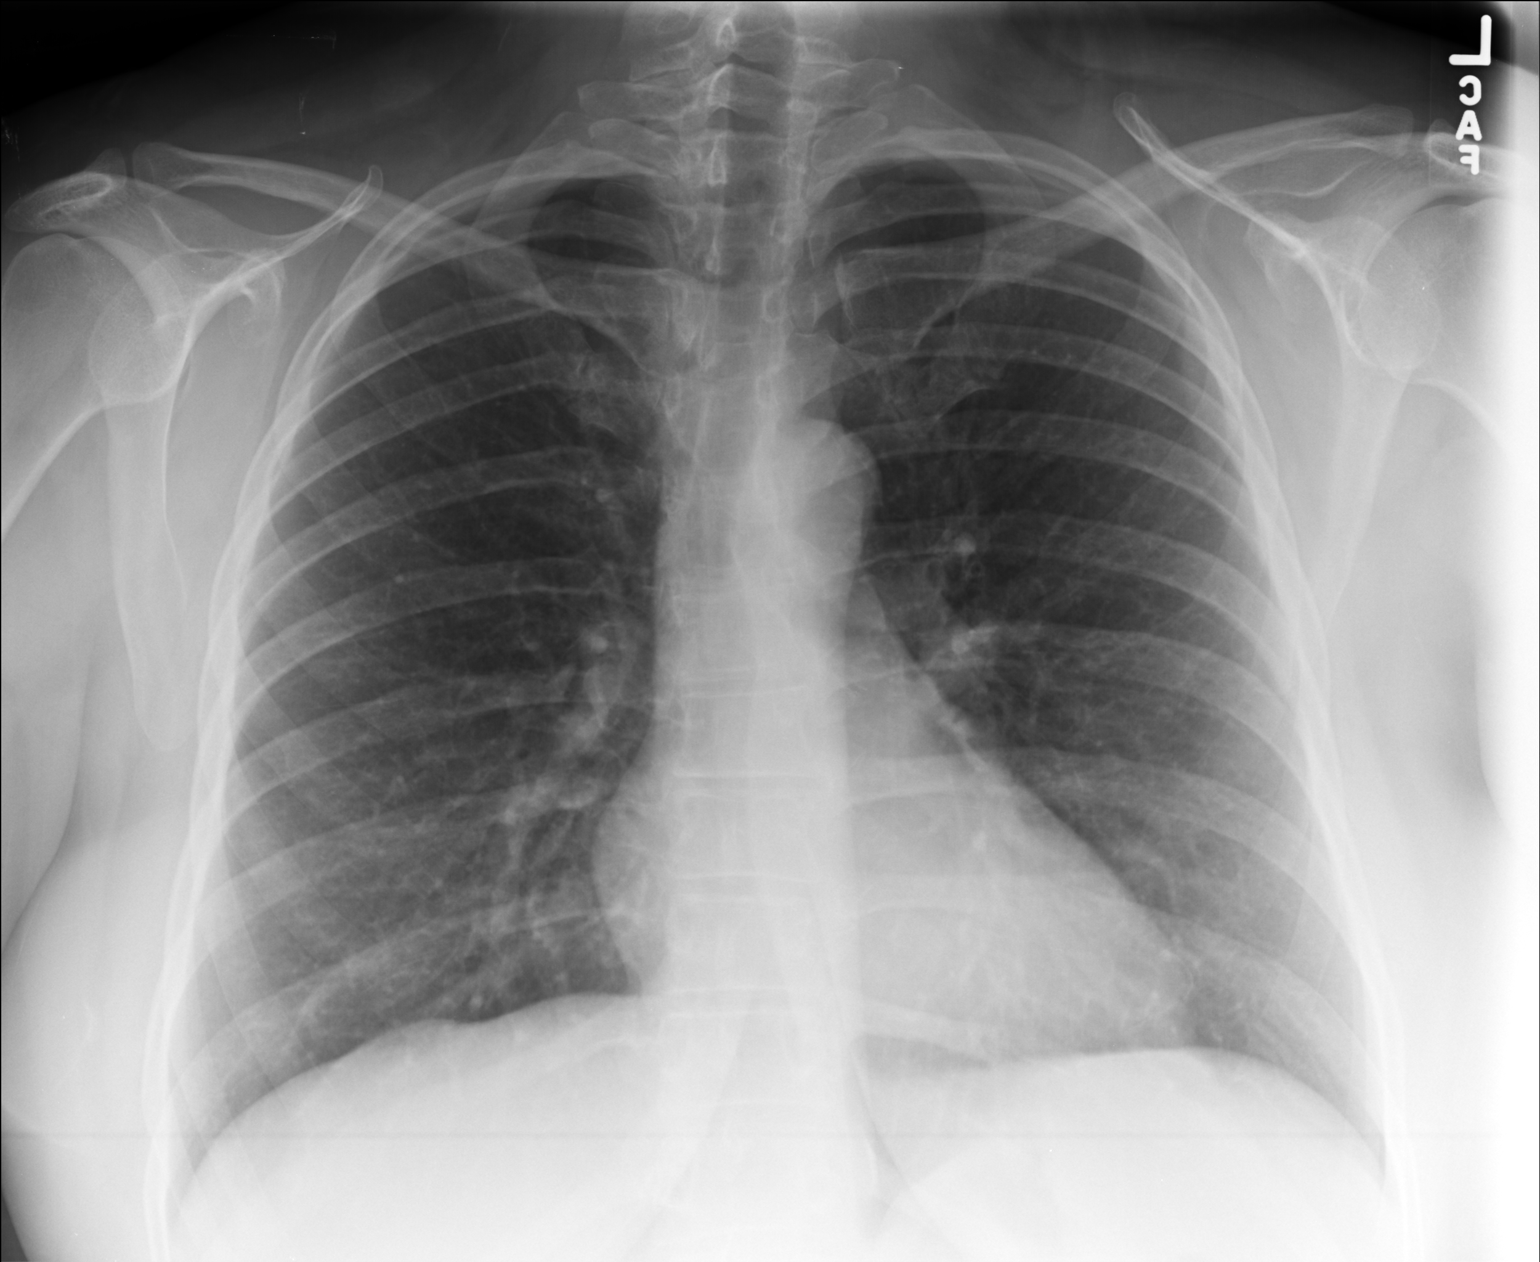

[lateral]
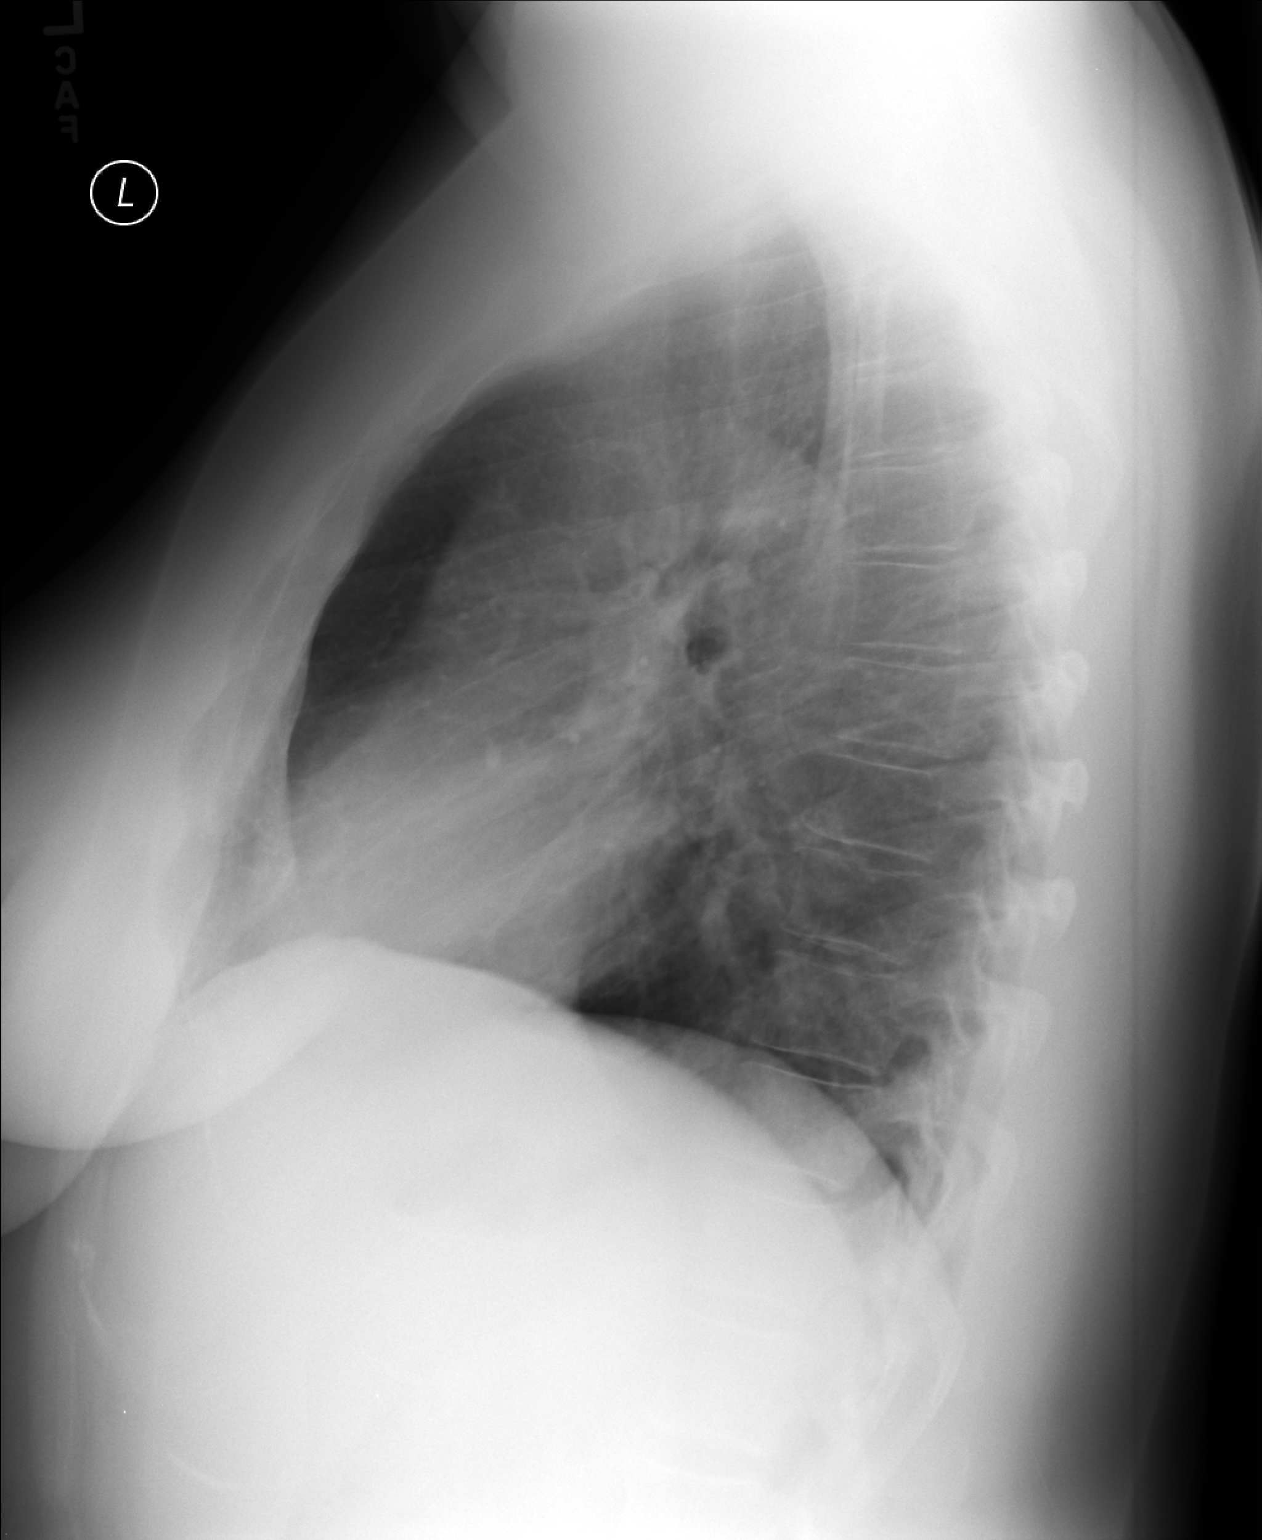

[2 of 2 positions shown; findings below may reference images not displayed]

FINDINGS: Normal lung volumes. Normal cardiac size and mediastinal contours.
Visualized tracheal air column is within normal limits. The lungs
are clear. No pneumothorax or effusion. No osseous abnormality
identified.
IMPRESSION: Negative, no acute cardiopulmonary abnormality.

## 2015-01-27 ENCOUNTER — Encounter: Payer: Self-pay | Admitting: Family Medicine

## 2015-01-27 DIAGNOSIS — E039 Hypothyroidism, unspecified: Secondary | ICD-10-CM

## 2015-01-28 ENCOUNTER — Encounter: Payer: Self-pay | Admitting: *Deleted

## 2015-01-28 MED ORDER — LEVOTHYROXINE SODIUM 150 MCG PO TABS: 150.0000 ug | ORAL_TABLET | Freq: Every day | ORAL | Status: DC

## 2015-01-29 MED ORDER — LEVOTHYROXINE SODIUM 150 MCG PO TABS: 150.0000 ug | ORAL_TABLET | Freq: Every day | ORAL | Status: DC

## 2015-01-29 NOTE — Addendum Note (Signed)
Addended by: Sheral Flow on: 01/29/2015 07:45 AM   Modules accepted: Orders

## 2015-03-13 LAB — OB RESULTS CONSOLE GBS: GBS: NEGATIVE

## 2015-03-17 ENCOUNTER — Telehealth: Payer: Self-pay | Admitting: *Deleted

## 2015-03-17 NOTE — Telephone Encounter (Signed)
Patient returned call and states that she will wait to have labs drawn after delivery of her child.

## 2015-03-17 NOTE — Telephone Encounter (Signed)
Patient should have TSH, T3 and T4 drawn around 03/11/2015.  Call placed to patient to inquire if labs have been obtained.   [place].Marland Kitchen

## 2015-03-23 ENCOUNTER — Ambulatory Visit (INDEPENDENT_AMBULATORY_CARE_PROVIDER_SITE_OTHER): Payer: Self-pay | Admitting: Pediatrics

## 2015-03-23 ENCOUNTER — Other Ambulatory Visit: Payer: Self-pay | Admitting: Obstetrics & Gynecology

## 2015-03-23 DIAGNOSIS — Z3490 Encounter for supervision of normal pregnancy, unspecified, unspecified trimester: Secondary | ICD-10-CM

## 2015-03-23 DIAGNOSIS — Z7681 Expectant parent(s) prebirth pediatrician visit: Secondary | ICD-10-CM

## 2015-03-23 NOTE — Progress Notes (Signed)
Prenatal counseling for impending newborn done-- Z76.81  

## 2015-04-01 ENCOUNTER — Encounter (HOSPITAL_COMMUNITY): Payer: Self-pay | Admitting: *Deleted

## 2015-04-01 ENCOUNTER — Telehealth (HOSPITAL_COMMUNITY): Payer: Self-pay | Admitting: *Deleted

## 2015-04-01 NOTE — Telephone Encounter (Signed)
Preadmission screen  

## 2015-04-03 ENCOUNTER — Other Ambulatory Visit: Payer: Self-pay | Admitting: Obstetrics & Gynecology

## 2015-04-05 ENCOUNTER — Inpatient Hospital Stay (HOSPITAL_COMMUNITY)
Admission: RE | Admit: 2015-04-05 | Discharge: 2015-04-09 | DRG: 765 | Disposition: A | Discharge status: Home / Self Care | Payer: BLUE CROSS/BLUE SHIELD | Source: Ambulatory Visit | Attending: Obstetrics | Admitting: Obstetrics

## 2015-04-05 ENCOUNTER — Encounter (HOSPITAL_COMMUNITY): Payer: Self-pay

## 2015-04-05 DIAGNOSIS — Z6841 Body Mass Index (BMI) 40.0 and over, adult: Secondary | ICD-10-CM

## 2015-04-05 DIAGNOSIS — J40 Bronchitis, not specified as acute or chronic: Secondary | ICD-10-CM | POA: Diagnosis present

## 2015-04-05 DIAGNOSIS — E039 Hypothyroidism, unspecified: Secondary | ICD-10-CM | POA: Diagnosis present

## 2015-04-05 DIAGNOSIS — O99214 Obesity complicating childbirth: Secondary | ICD-10-CM | POA: Diagnosis present

## 2015-04-05 DIAGNOSIS — O134 Gestational [pregnancy-induced] hypertension without significant proteinuria, complicating childbirth: Secondary | ICD-10-CM | POA: Diagnosis present

## 2015-04-05 DIAGNOSIS — Z3A38 38 weeks gestation of pregnancy: Secondary | ICD-10-CM

## 2015-04-05 DIAGNOSIS — O3660X0 Maternal care for excessive fetal growth, unspecified trimester, not applicable or unspecified: Secondary | ICD-10-CM | POA: Diagnosis present

## 2015-04-05 DIAGNOSIS — O24424 Gestational diabetes mellitus in childbirth, insulin controlled: Secondary | ICD-10-CM | POA: Diagnosis present

## 2015-04-05 DIAGNOSIS — O99284 Endocrine, nutritional and metabolic diseases complicating childbirth: Secondary | ICD-10-CM | POA: Diagnosis present

## 2015-04-05 DIAGNOSIS — J069 Acute upper respiratory infection, unspecified: Secondary | ICD-10-CM | POA: Diagnosis present

## 2015-04-05 DIAGNOSIS — Z87891 Personal history of nicotine dependence: Secondary | ICD-10-CM | POA: Diagnosis not present

## 2015-04-05 DIAGNOSIS — R059 Cough, unspecified: Secondary | ICD-10-CM

## 2015-04-05 DIAGNOSIS — O139 Gestational [pregnancy-induced] hypertension without significant proteinuria, unspecified trimester: Secondary | ICD-10-CM | POA: Diagnosis present

## 2015-04-05 DIAGNOSIS — O9952 Diseases of the respiratory system complicating childbirth: Secondary | ICD-10-CM | POA: Diagnosis present

## 2015-04-05 DIAGNOSIS — O24414 Gestational diabetes mellitus in pregnancy, insulin controlled: Secondary | ICD-10-CM

## 2015-04-05 DIAGNOSIS — R05 Cough: Secondary | ICD-10-CM

## 2015-04-05 LAB — TYPE AND SCREEN
ABO/RH(D): A POS
Antibody Screen: NEGATIVE

## 2015-04-05 LAB — PROTEIN / CREATININE RATIO, URINE
Creatinine, Urine: 34 mg/dL
Protein Creatinine Ratio: 0.35 mg/mg{Cre} — ABNORMAL HIGH (ref 0.00–0.15)
Total Protein, Urine: 12 mg/dL

## 2015-04-05 LAB — COMPREHENSIVE METABOLIC PANEL
ALK PHOS: 136 U/L — AB (ref 38–126)
ALT: 19 U/L (ref 14–54)
AST: 24 U/L (ref 15–41)
Albumin: 2.8 g/dL — ABNORMAL LOW (ref 3.5–5.0)
Anion gap: 9 (ref 5–15)
BILIRUBIN TOTAL: 0.3 mg/dL (ref 0.3–1.2)
BUN: 10 mg/dL (ref 6–20)
CALCIUM: 8.7 mg/dL — AB (ref 8.9–10.3)
CHLORIDE: 107 mmol/L (ref 101–111)
CO2: 21 mmol/L — ABNORMAL LOW (ref 22–32)
CREATININE: 0.48 mg/dL (ref 0.44–1.00)
GFR calc Af Amer: >60 mL/min (ref >60)
Glucose, Bld: 233 mg/dL — ABNORMAL HIGH (ref 65–99)
Potassium: 4 mmol/L (ref 3.5–5.1)
Sodium: 137 mmol/L (ref 135–145)
TOTAL PROTEIN: 6 g/dL — AB (ref 6.5–8.1)

## 2015-04-05 LAB — GLUCOSE, CAPILLARY
GLUCOSE-CAPILLARY: 149 mg/dL — AB (ref 65–99)
Glucose-Capillary: 213 mg/dL — ABNORMAL HIGH (ref 65–99)
Glucose-Capillary: 72 mg/dL (ref 65–99)
Glucose-Capillary: 101 mg/dL — ABNORMAL HIGH (ref 65–99)

## 2015-04-05 LAB — CBC
HCT: 35.6 % — ABNORMAL LOW (ref 36.0–46.0)
Hemoglobin: 12 g/dL (ref 12.0–15.0)
MCH: 28.6 pg (ref 26.0–34.0)
MCHC: 33.7 g/dL (ref 30.0–36.0)
MCV: 84.8 fL (ref 78.0–100.0)
PLATELETS: 227 10*3/uL (ref 150–400)
RBC: 4.2 MIL/uL (ref 3.87–5.11)
RDW: 15.2 % (ref 11.5–15.5)
WBC: 9.1 10*3/uL (ref 4.0–10.5)

## 2015-04-05 LAB — URIC ACID: URIC ACID, SERUM: 4 mg/dL (ref 2.3–6.6)

## 2015-04-05 LAB — RPR: RPR: NONREACTIVE

## 2015-04-05 LAB — ABO/RH: ABO/RH(D): A POS

## 2015-04-05 MED ORDER — TERBUTALINE SULFATE 1 MG/ML IJ SOLN: 0.2500 mg | Freq: Once | INTRAMUSCULAR | Status: DC | PRN

## 2015-04-05 MED ORDER — OXYCODONE-ACETAMINOPHEN 5-325 MG PO TABS: 2.0000 | ORAL_TABLET | ORAL | Status: DC | PRN

## 2015-04-05 MED ORDER — INSULIN REGULAR HUMAN 100 UNIT/ML IJ SOLN: 8.0000 [IU] | Freq: Once | INTRAMUSCULAR | Status: DC

## 2015-04-05 MED ORDER — OXYTOCIN 40 UNITS IN LACTATED RINGERS INFUSION - SIMPLE MED
1.0000 m[IU]/min | INTRAVENOUS | Status: DC
  Administered 2015-04-05: 2 m[IU]/min via INTRAVENOUS
  Administered 2015-04-05: 8 m[IU]/min via INTRAVENOUS
  Administered 2015-04-06: 18 m[IU]/min via INTRAVENOUS
  Filled 2015-04-05: qty 1000

## 2015-04-05 MED ORDER — FENTANYL 2.5 MCG/ML BUPIVACAINE 1/10 % EPIDURAL INFUSION (WH - ANES)
14.0000 mL/h | INTRAMUSCULAR | Status: DC | PRN
  Administered 2015-04-06 (×3): 14 mL/h via EPIDURAL
  Filled 2015-04-05 (×3): qty 125

## 2015-04-05 MED ORDER — OXYTOCIN 40 UNITS IN LACTATED RINGERS INFUSION - SIMPLE MED: 62.5000 mL/h | INTRAVENOUS | Status: DC

## 2015-04-05 MED ORDER — ONDANSETRON HCL 4 MG/2ML IJ SOLN: 4.0000 mg | Freq: Four times a day (QID) | INTRAMUSCULAR | Status: DC | PRN

## 2015-04-05 MED ORDER — OXYTOCIN BOLUS FROM INFUSION: 500.0000 mL | INTRAVENOUS | Status: DC

## 2015-04-05 MED ORDER — INSULIN ASPART 100 UNIT/ML ~~LOC~~ SOLN
8.0000 [IU] | Freq: Once | SUBCUTANEOUS | Status: AC
  Administered 2015-04-05: 8 [IU] via SUBCUTANEOUS

## 2015-04-05 MED ORDER — EPHEDRINE 5 MG/ML INJ: 10.0000 mg | INTRAVENOUS | Status: DC | PRN

## 2015-04-05 MED ORDER — DIPHENHYDRAMINE HCL 50 MG/ML IJ SOLN: 12.5000 mg | INTRAMUSCULAR | Status: DC | PRN

## 2015-04-05 MED ORDER — LACTATED RINGERS IV SOLN
INTRAVENOUS | Status: DC
  Administered 2015-04-05 – 2015-04-06 (×4): via INTRAVENOUS

## 2015-04-05 MED ORDER — MISOPROSTOL 50MCG HALF TABLET
50.0000 ug | ORAL_TABLET | ORAL | Status: DC | PRN
  Administered 2015-04-05 (×2): 50 ug via ORAL
  Filled 2015-04-05 (×3): qty 0.5

## 2015-04-05 MED ORDER — PHENYLEPHRINE 40 MCG/ML (10ML) SYRINGE FOR IV PUSH (FOR BLOOD PRESSURE SUPPORT)
80.0000 ug | PREFILLED_SYRINGE | INTRAVENOUS | Status: DC | PRN
  Filled 2015-04-05: qty 20

## 2015-04-05 MED ORDER — LACTATED RINGERS IV SOLN
500.0000 mL | INTRAVENOUS | Status: DC | PRN
  Administered 2015-04-06: 500 mL via INTRAVENOUS

## 2015-04-05 MED ORDER — OXYCODONE-ACETAMINOPHEN 5-325 MG PO TABS: 1.0000 | ORAL_TABLET | ORAL | Status: DC | PRN

## 2015-04-05 MED ORDER — CITRIC ACID-SODIUM CITRATE 334-500 MG/5ML PO SOLN
30.0000 mL | ORAL | Status: DC | PRN
  Administered 2015-04-06: 30 mL via ORAL
  Filled 2015-04-05: qty 15

## 2015-04-05 MED ORDER — ACETAMINOPHEN 325 MG PO TABS: 650.0000 mg | ORAL_TABLET | ORAL | Status: DC | PRN

## 2015-04-05 MED ORDER — LIDOCAINE HCL (PF) 1 % IJ SOLN: 30.0000 mL | INTRAMUSCULAR | Status: DC | PRN

## 2015-04-05 NOTE — Progress Notes (Signed)
Patient ID: Charlotte Carter, female   DOB: [birth year], 39 y.o.   MRN: [id number] [facility] labs - CBC, CMP, uric acid normal. Urine p/c ratio 0.35 (0.3 cut off). BP better. Still possible PEC vs GHTN, asymptomatic, deferring magnesium now. Cont to monitor BPs and BS. BS better 1hr postmeal after elevated BS this AM and s/p 8u Insulin  FHT cat I Cytotec 2nd dose given earlier V.Benjie Karvonen, MD

## 2015-04-05 NOTE — Progress Notes (Signed)
Patient ID: Charlotte Carter, female   DOB: July 18, 1975, 39 y.o.   MRN: [id number] S/p Cytotec x 2. Some UCs noted, pt feels well.  Cx ext os open, internal os closed, Foley catheter inserted failed after several attempts. Patient tolerated well.  Will start pitocin per protocol  Dr Dellis Filbert informed

## 2015-04-05 NOTE — Progress Notes (Signed)
Subjective: Doing well, pain mild, UCs 3-6 min  Anesthesia None   Objective: BP 162/86 mmHg  Pulse 84  Temp(Src) 98.7 F (37.1 C) (Oral)  Resp 18  Ht 5\' 10"  (1.778 m)  Wt 280 lb (127.007 kg)  BMI 40.18 kg/m2  LMP 07/09/2014   FHT:  135/min, good variability, no deceleration, accelerations present UC:   irregular, every 3-6 minutes VE:   Dilation: 2 Effacement (%): 50 Station: -3 Exam by:: Dellis Filbert, MD    Assessment / Plan: Induction of labor due to Charlotte Carter, Obesity, AMA.  Latency phase.  Increasing Pitocin.  Fetal Wellbeing:  Category I Pain Control:  Labor support without medications  Anticipated MOD:  NSVD  Charlotte Carter,Charlotte Carter 04/05/2015, 9:32 PM

## 2015-04-05 NOTE — Progress Notes (Signed)
Charlotte Carter is a 39 y.o. G1P0 at [redacted]w[redacted]d, IOL for A2GDM.  AMA, maternal obesity.  S/p Cytotec x 1 dose at admission.  RN informed of elevated BPs and PIH labs were sent. Patient denies HA/vision changes/ RUQ or epigastric pain  Objective: BP 165/83 mmHg  Pulse 75  Temp(Src) 98.6 F (37 C) (Oral)  Resp 20  Ht 5\' 10"  (1.778 m)  Wt 280 lb (127.007 kg)  BMI 40.18 kg/m2  LMP 07/09/2014     FHT:  FHR: 145-150 bpm, variability: moderate,  accelerations:  Present,  decelerations:  Absent UC:   none SVE:   Dilation: Closed Effacement (%): 30 Station: -3 Exam by:: kim fields, rn  Labs: Lab Results  Component Value Date   WBC 9.1 04/05/2015   HGB 12.0 04/05/2015   HCT 35.6* 04/05/2015   MCV 84.8 04/05/2015   PLT 227 04/05/2015   CMP     Component Value Date/Time   NA 137 04/05/2015 0800   K 4.0 04/05/2015 0800   CL 107 04/05/2015 0800   CO2 21* 04/05/2015 0800   GLUCOSE 233* 04/05/2015 0800   BUN 10 04/05/2015 0800   CREATININE 0.48 04/05/2015 0800   CREATININE 0.63 02/21/2014 0858   CALCIUM 8.7* 04/05/2015 0800   PROT 6.0* 04/05/2015 0800   ALBUMIN 2.8* 04/05/2015 0800   AST 24 04/05/2015 0800   ALT 19 04/05/2015 0800   ALKPHOS 136* 04/05/2015 0800   BILITOT 0.3 04/05/2015 0800   GFRNONAA >60 04/05/2015 0800   GFRNONAA >89 02/21/2014 0858   GFRAA >60 04/05/2015 0800   GFRAA >89 02/21/2014 0858    Assessment / Plan: 39 yo G1, IOL for A2GDM. Obesity. Pt on Metformin, Glyburide, Insulin with high admission BS, plan to check every 4 hours now and every 1-2 hours in active labor.  Elevated BPs- likely GestHTN now, check urine p/c ratio, continue to monitor for PEC symptoms and urine output.   Fetal Wellbeing:  Category I Pain Control:  none needed now   Anticipated MOD:  Guarded now, reassess in active labor Dr Dellis Filbert will take over care at 5 pm.   Mya Suell R 04/05/2015, 12:34 PM

## 2015-04-05 NOTE — H&P (Signed)
Charlotte Carter is a 39 y.o. female G1P0 [redacted]w[redacted]d GDMA2 Glyburide/Insulin presenting for Induction of Labor with Cytotec.  HPP/HPI:  GDMA2 Dxed at 12 wks on Glyburide and Insulin, fairly controled, but DM diet not consistently followed. Obesity. AMA.  Hypothyroidism. Last [place] EFW 8 Lbs 6 Oz.  AFI wnl.  Cervix unfavorable.  OB History    Gravida Para Term Preterm AB TAB SAB Ectopic Multiple Living   1              Past Medical History  Diagnosis Date  . Thyroid disease   . Rosacea   . Hyperlipidemia   . GDM (gestational diabetes mellitus)   . Hypothyroidism   . Hx of varicella   . Vaginal Pap smear, abnormal    Past Surgical History  Procedure Laterality Date  . Wisdom tooth extraction     Family History: family history includes Alcohol abuse in her father and mother; Arthritis in her father and mother; Asthma in her sister; Diabetes in her maternal grandmother and mother; Heart disease in her father; Hyperlipidemia in her father and mother; Hypertension in her father, mother, and sister; Hypothyroidism in her father and mother. Social History:  reports that she has quit smoking. She does not have any smokeless tobacco history on file. She reports that she drinks about 10.5 oz of alcohol per week. She reports that she does not use illicit drugs.  No Known Allergies    Blood pressure 156/75, pulse 90, temperature 98.6 F (37 C), temperature source Oral, resp. rate 20, height 5\' 10"  (1.778 m), weight 280 lb (127.007 kg), last menstrual period 07/09/2014. Exam Physical Exam   VE in office:  1 cm/long/post/Vtx/-3.  Membranes intact.  FHR 130-140's good variability, accelerations present, no deceleration.  Reactive.  Cat 1. No reg UCs.  HPP:  Patient Active Problem List   Diagnosis Date Noted  . Gestational diabetes requiring insulin 04/05/2015  . Glucose intolerance (pre-diabetes) 03/11/2014  . Skin tag 02/28/2013  . Cyst of skin 02/28/2013  . Intertrigo labialis 06/27/2012  .  Epidermoid cyst of skin 02/03/2012  . Hyperlipidemia 02/03/2012  . Hypothyroidism 01/08/2012  . Boils 01/08/2012  . Obesity 01/08/2012  . Rosacea 01/08/2012    Prenatal labs: ABO, Rh: A/Positive/-- (05/27 0000) Antibody: Negative (05/27 0000) Rubella: Immune RPR: Nonreactive (05/27 0000)  HBsAg: Negative (05/27 0000)  HIV: Non-reactive (05/27 0000)  Genetic testing: Ultrascreen wnl, AFP1 neg [place] anato: wnl 1 hr GTT: Abnormal.  3 hr GTT Abnormal at 12+ wks. GBS: Negative (11/25 0000)   Assessment/Plan: 39 2/7 wks GDMA2 suboptimally controled/Obesity/AMA/Hypothyroidism/Macrosomia for Cytotec Induction.  Monitoring.  Cat 1.  Pitocin/AROM per progression.   [place] Schoene,MARIE-LYNE 04/05/2015, 8:26 AM

## 2015-04-06 ENCOUNTER — Encounter (HOSPITAL_COMMUNITY)
Admission: RE | Disposition: A | Discharge status: Home / Self Care | Payer: Self-pay | Source: Ambulatory Visit | Attending: Obstetrics

## 2015-04-06 ENCOUNTER — Encounter (HOSPITAL_COMMUNITY): Payer: Self-pay

## 2015-04-06 ENCOUNTER — Inpatient Hospital Stay (HOSPITAL_COMMUNITY): Payer: BLUE CROSS/BLUE SHIELD | Admitting: Anesthesiology

## 2015-04-06 ENCOUNTER — Inpatient Hospital Stay (HOSPITAL_COMMUNITY): Payer: BLUE CROSS/BLUE SHIELD

## 2015-04-06 DIAGNOSIS — O139 Gestational [pregnancy-induced] hypertension without significant proteinuria, unspecified trimester: Secondary | ICD-10-CM | POA: Diagnosis present

## 2015-04-06 LAB — COMPREHENSIVE METABOLIC PANEL
ALBUMIN: 2.5 g/dL — AB (ref 3.5–5.0)
ALK PHOS: 131 U/L — AB (ref 38–126)
ALT: 19 U/L (ref 14–54)
AST: 22 U/L (ref 15–41)
Anion gap: 9 (ref 5–15)
BUN: 7 mg/dL (ref 6–20)
CALCIUM: 8.6 mg/dL — AB (ref 8.9–10.3)
CO2: 23 mmol/L (ref 22–32)
CREATININE: 0.48 mg/dL (ref 0.44–1.00)
Chloride: 104 mmol/L (ref 101–111)
GFR calc Af Amer: >60 mL/min (ref >60)
GFR calc non Af Amer: >60 mL/min (ref >60)
GLUCOSE: 130 mg/dL — AB (ref 65–99)
Potassium: 3.8 mmol/L (ref 3.5–5.1)
SODIUM: 136 mmol/L (ref 135–145)
Total Bilirubin: 0.8 mg/dL (ref 0.3–1.2)
Total Protein: 5.7 g/dL — ABNORMAL LOW (ref 6.5–8.1)

## 2015-04-06 LAB — GLUCOSE, CAPILLARY
GLUCOSE-CAPILLARY: 84 mg/dL (ref 65–99)
GLUCOSE-CAPILLARY: 70 mg/dL (ref 65–99)
Glucose-Capillary: 95 mg/dL (ref 65–99)
Glucose-Capillary: 72 mg/dL (ref 65–99)
Glucose-Capillary: 104 mg/dL — ABNORMAL HIGH (ref 65–99)
Glucose-Capillary: 86 mg/dL (ref 65–99)

## 2015-04-06 LAB — CBC
HEMATOCRIT: 37.6 % (ref 36.0–46.0)
HEMOGLOBIN: 12.3 g/dL (ref 12.0–15.0)
MCH: 27.7 pg (ref 26.0–34.0)
MCHC: 32.7 g/dL (ref 30.0–36.0)
MCV: 84.7 fL (ref 78.0–100.0)
Platelets: 211 10*3/uL (ref 150–400)
RBC: 4.44 MIL/uL (ref 3.87–5.11)
RDW: 15 % (ref 11.5–15.5)
WBC: 9.3 10*3/uL (ref 4.0–10.5)

## 2015-04-06 LAB — URIC ACID: Uric Acid, Serum: 4.7 mg/dL (ref 2.3–6.6)

## 2015-04-06 SURGERY — Surgical Case
Anesthesia: Epidural

## 2015-04-06 MED ORDER — NALBUPHINE HCL 10 MG/ML IJ SOLN: 5.0000 mg | INTRAMUSCULAR | Status: DC | PRN

## 2015-04-06 MED ORDER — OXYCODONE-ACETAMINOPHEN 5-325 MG PO TABS: 2.0000 | ORAL_TABLET | ORAL | Status: DC | PRN

## 2015-04-06 MED ORDER — SCOPOLAMINE 1 MG/3DAYS TD PT72
MEDICATED_PATCH | TRANSDERMAL | Status: AC
  Filled 2015-04-06: qty 1

## 2015-04-06 MED ORDER — LEVOTHYROXINE SODIUM 150 MCG PO TABS
150.0000 ug | ORAL_TABLET | Freq: Every day | ORAL | Status: DC
  Administered 2015-04-06: 150 ug via ORAL
  Filled 2015-04-06 (×2): qty 1

## 2015-04-06 MED ORDER — ACETAMINOPHEN 325 MG PO TABS: 650.0000 mg | ORAL_TABLET | ORAL | Status: DC | PRN

## 2015-04-06 MED ORDER — LEVOTHYROXINE SODIUM 150 MCG PO TABS
150.0000 ug | ORAL_TABLET | Freq: Every day | ORAL | Status: DC
Start: 2015-04-07 — End: 2015-04-09
  Administered 2015-04-07 – 2015-04-09 (×3): 150 ug via ORAL
  Filled 2015-04-06 (×4): qty 1

## 2015-04-06 MED ORDER — ACETAMINOPHEN 500 MG PO TABS
1000.0000 mg | ORAL_TABLET | Freq: Four times a day (QID) | ORAL | Status: AC
  Administered 2015-04-07 (×2): 1000 mg via ORAL
  Filled 2015-04-06 (×2): qty 2

## 2015-04-06 MED ORDER — DIBUCAINE 1 % RE OINT
1.0000 "application " | TOPICAL_OINTMENT | RECTAL | Status: DC | PRN
  Filled 2015-04-06: qty 28

## 2015-04-06 MED ORDER — DIPHENHYDRAMINE HCL 50 MG/ML IJ SOLN: 12.5000 mg | INTRAMUSCULAR | Status: DC | PRN

## 2015-04-06 MED ORDER — PHENYLEPHRINE 40 MCG/ML (10ML) SYRINGE FOR IV PUSH (FOR BLOOD PRESSURE SUPPORT)
PREFILLED_SYRINGE | INTRAVENOUS | Status: AC
  Filled 2015-04-06: qty 10

## 2015-04-06 MED ORDER — DIPHENHYDRAMINE HCL 25 MG PO CAPS
25.0000 mg | ORAL_CAPSULE | ORAL | Status: DC | PRN
  Filled 2015-04-06: qty 1

## 2015-04-06 MED ORDER — SODIUM BICARBONATE 8.4 % IV SOLN
INTRAVENOUS | Status: AC
  Filled 2015-04-06: qty 50

## 2015-04-06 MED ORDER — SIMETHICONE 80 MG PO CHEW
80.0000 mg | CHEWABLE_TABLET | Freq: Three times a day (TID) | ORAL | Status: DC
  Administered 2015-04-07 – 2015-04-09 (×6): 80 mg via ORAL
  Filled 2015-04-06 (×9): qty 1

## 2015-04-06 MED ORDER — BUPIVACAINE HCL (PF) 0.25 % IJ SOLN
INTRAMUSCULAR | Status: AC
  Filled 2015-04-06: qty 10

## 2015-04-06 MED ORDER — MORPHINE SULFATE (PF) 0.5 MG/ML IJ SOLN
INTRAMUSCULAR | Status: AC
  Filled 2015-04-06: qty 10

## 2015-04-06 MED ORDER — ONDANSETRON HCL 4 MG/2ML IJ SOLN
INTRAMUSCULAR | Status: DC | PRN
  Administered 2015-04-06: 4 mg via INTRAVENOUS

## 2015-04-06 MED ORDER — PHENYLEPHRINE HCL 10 MG/ML IJ SOLN
INTRAMUSCULAR | Status: DC | PRN
  Administered 2015-04-06 (×2): 40 ug via INTRAVENOUS

## 2015-04-06 MED ORDER — LIDOCAINE-EPINEPHRINE (PF) 2 %-1:200000 IJ SOLN
INTRAMUSCULAR | Status: AC
  Filled 2015-04-06: qty 20

## 2015-04-06 MED ORDER — OXYCODONE-ACETAMINOPHEN 5-325 MG PO TABS
1.0000 | ORAL_TABLET | ORAL | Status: DC | PRN
  Administered 2015-04-08 – 2015-04-09 (×5): 1 via ORAL
  Filled 2015-04-06 (×5): qty 1

## 2015-04-06 MED ORDER — SENNOSIDES-DOCUSATE SODIUM 8.6-50 MG PO TABS
2.0000 | ORAL_TABLET | ORAL | Status: DC
  Administered 2015-04-08 – 2015-04-09 (×2): 2 via ORAL
  Filled 2015-04-06 (×5): qty 2

## 2015-04-06 MED ORDER — KETOROLAC TROMETHAMINE 30 MG/ML IJ SOLN
30.0000 mg | Freq: Four times a day (QID) | INTRAMUSCULAR | Status: DC | PRN
Start: 2015-04-06 — End: 2015-04-06

## 2015-04-06 MED ORDER — LABETALOL HCL 5 MG/ML IV SOLN
20.0000 mg | INTRAVENOUS | Status: AC | PRN
  Administered 2015-04-06: 20 mg via INTRAVENOUS
  Administered 2015-04-06: 80 mg via INTRAVENOUS
  Administered 2015-04-06: 40 mg via INTRAVENOUS
  Filled 2015-04-06: qty 16
  Filled 2015-04-06: qty 8
  Filled 2015-04-06: qty 4

## 2015-04-06 MED ORDER — MEPERIDINE HCL 25 MG/ML IJ SOLN: 6.2500 mg | INTRAMUSCULAR | Status: DC | PRN

## 2015-04-06 MED ORDER — KETOROLAC TROMETHAMINE 30 MG/ML IJ SOLN
INTRAMUSCULAR | Status: AC
  Administered 2015-04-06: 30 mg via INTRAMUSCULAR
  Filled 2015-04-06: qty 1

## 2015-04-06 MED ORDER — OXYTOCIN 40 UNITS IN LACTATED RINGERS INFUSION - SIMPLE MED: 62.5000 mL/h | INTRAVENOUS | Status: AC

## 2015-04-06 MED ORDER — LACTATED RINGERS IV SOLN
INTRAVENOUS | Status: DC
  Administered 2015-04-07: 05:00:00 via INTRAVENOUS

## 2015-04-06 MED ORDER — IBUPROFEN 600 MG PO TABS
600.0000 mg | ORAL_TABLET | Freq: Four times a day (QID) | ORAL | Status: DC
  Administered 2015-04-07 – 2015-04-09 (×10): 600 mg via ORAL
  Filled 2015-04-06 (×10): qty 1

## 2015-04-06 MED ORDER — PROMETHAZINE HCL 25 MG/ML IJ SOLN: 6.2500 mg | INTRAMUSCULAR | Status: DC | PRN

## 2015-04-06 MED ORDER — KETOROLAC TROMETHAMINE 30 MG/ML IJ SOLN
30.0000 mg | Freq: Four times a day (QID) | INTRAMUSCULAR | Status: DC | PRN
  Administered 2015-04-06: 30 mg via INTRAMUSCULAR

## 2015-04-06 MED ORDER — SIMETHICONE 80 MG PO CHEW
80.0000 mg | CHEWABLE_TABLET | ORAL | Status: DC | PRN
  Filled 2015-04-06: qty 1

## 2015-04-06 MED ORDER — LANOLIN HYDROUS EX OINT: 1.0000 "application " | TOPICAL_OINTMENT | CUTANEOUS | Status: DC | PRN

## 2015-04-06 MED ORDER — PRENATAL MULTIVITAMIN CH
1.0000 | ORAL_TABLET | Freq: Every day | ORAL | Status: DC
  Administered 2015-04-07 – 2015-04-09 (×3): 1 via ORAL
  Filled 2015-04-06 (×4): qty 1

## 2015-04-06 MED ORDER — SIMETHICONE 80 MG PO CHEW
80.0000 mg | CHEWABLE_TABLET | ORAL | Status: DC
  Administered 2015-04-08 – 2015-04-09 (×2): 80 mg via ORAL
  Filled 2015-04-06 (×4): qty 1

## 2015-04-06 MED ORDER — SCOPOLAMINE 1 MG/3DAYS TD PT72: 1.0000 | MEDICATED_PATCH | Freq: Once | TRANSDERMAL | Status: DC

## 2015-04-06 MED ORDER — TETANUS-DIPHTH-ACELL PERTUSSIS 5-2.5-18.5 LF-MCG/0.5 IM SUSP
0.5000 mL | Freq: Once | INTRAMUSCULAR | Status: DC
  Filled 2015-04-06: qty 0.5

## 2015-04-06 MED ORDER — HYDRALAZINE HCL 20 MG/ML IJ SOLN
10.0000 mg | Freq: Once | INTRAMUSCULAR | Status: AC | PRN
  Administered 2015-04-06: 10 mg via INTRAVENOUS
  Filled 2015-04-06: qty 1

## 2015-04-06 MED ORDER — LIDOCAINE HCL (PF) 1 % IJ SOLN
INTRAMUSCULAR | Status: DC | PRN
  Administered 2015-04-06 (×2): 8 mL via EPIDURAL

## 2015-04-06 MED ORDER — ZOLPIDEM TARTRATE 5 MG PO TABS: 5.0000 mg | ORAL_TABLET | Freq: Every evening | ORAL | Status: DC | PRN

## 2015-04-06 MED ORDER — MORPHINE SULFATE (PF) 0.5 MG/ML IJ SOLN
INTRAMUSCULAR | Status: DC | PRN
  Administered 2015-04-06: 3 mg via EPIDURAL

## 2015-04-06 MED ORDER — OXYTOCIN 10 UNIT/ML IJ SOLN
40.0000 [IU] | INTRAVENOUS | Status: DC | PRN
  Administered 2015-04-06: 40 [IU] via INTRAVENOUS

## 2015-04-06 MED ORDER — SCOPOLAMINE 1 MG/3DAYS TD PT72
MEDICATED_PATCH | TRANSDERMAL | Status: DC | PRN
  Administered 2015-04-06: 1 via TRANSDERMAL

## 2015-04-06 MED ORDER — NALOXONE HCL 2 MG/2ML IJ SOSY
1.0000 ug/kg/h | PREFILLED_SYRINGE | INTRAVENOUS | Status: DC | PRN
  Filled 2015-04-06: qty 2

## 2015-04-06 MED ORDER — OXYTOCIN 10 UNIT/ML IJ SOLN
INTRAMUSCULAR | Status: AC
  Filled 2015-04-06: qty 4

## 2015-04-06 MED ORDER — NALOXONE HCL 0.4 MG/ML IJ SOLN: 0.4000 mg | INTRAMUSCULAR | Status: DC | PRN

## 2015-04-06 MED ORDER — NALBUPHINE HCL 10 MG/ML IJ SOLN: 5.0000 mg | Freq: Once | INTRAMUSCULAR | Status: DC | PRN

## 2015-04-06 MED ORDER — FENTANYL CITRATE (PF) 100 MCG/2ML IJ SOLN: 25.0000 ug | INTRAMUSCULAR | Status: DC | PRN

## 2015-04-06 MED ORDER — SODIUM BICARBONATE 8.4 % IV SOLN
INTRAVENOUS | Status: DC | PRN
  Administered 2015-04-06 (×5): 5 mL via EPIDURAL

## 2015-04-06 MED ORDER — ONDANSETRON HCL 4 MG/2ML IJ SOLN: 4.0000 mg | Freq: Three times a day (TID) | INTRAMUSCULAR | Status: DC | PRN

## 2015-04-06 MED ORDER — ONDANSETRON HCL 4 MG/2ML IJ SOLN
INTRAMUSCULAR | Status: AC
  Filled 2015-04-06: qty 2

## 2015-04-06 MED ORDER — DEXTROSE 5 % IV SOLN
INTRAVENOUS | Status: AC
  Filled 2015-04-06: qty 3000

## 2015-04-06 MED ORDER — LACTATED RINGERS IV SOLN
INTRAVENOUS | Status: DC | PRN
  Administered 2015-04-06 (×3): via INTRAVENOUS

## 2015-04-06 MED ORDER — WITCH HAZEL-GLYCERIN EX PADS: 1.0000 "application " | MEDICATED_PAD | CUTANEOUS | Status: DC | PRN

## 2015-04-06 MED ORDER — LACTATED RINGERS IV SOLN
INTRAVENOUS | Status: DC | PRN
  Administered 2015-04-06: 22:00:00 via INTRAVENOUS

## 2015-04-06 MED ORDER — DIPHENHYDRAMINE HCL 25 MG PO CAPS
25.0000 mg | ORAL_CAPSULE | Freq: Four times a day (QID) | ORAL | Status: DC | PRN
  Filled 2015-04-06: qty 1

## 2015-04-06 MED ORDER — DEXTROSE 5 % IV SOLN
3.0000 g | INTRAVENOUS | Status: DC | PRN
  Administered 2015-04-06: 3 g via INTRAVENOUS

## 2015-04-06 MED ORDER — SODIUM CHLORIDE 0.9 % IJ SOLN: 3.0000 mL | INTRAMUSCULAR | Status: DC | PRN

## 2015-04-06 MED ORDER — LIDOCAINE HCL 1 % IJ SOLN
INTRAMUSCULAR | Status: AC
  Filled 2015-04-06: qty 20

## 2015-04-06 MED ORDER — MENTHOL 3 MG MT LOZG
1.0000 | LOZENGE | OROMUCOSAL | Status: DC | PRN
  Filled 2015-04-06 (×3): qty 9

## 2015-04-06 SURGICAL SUPPLY — 35 items
CLAMP CORD UMBIL (MISCELLANEOUS) IMPLANT
CLOSURE WOUND 1/2 X4 (GAUZE/BANDAGES/DRESSINGS)
CLOTH BEACON ORANGE TIMEOUT ST (SAFETY) ×3 IMPLANT
CONTAINER PREFILL 10% NBF 15ML (MISCELLANEOUS) IMPLANT
DRAPE SHEET LG 3/4 BI-LAMINATE (DRAPES) IMPLANT
DRSG OPSITE POSTOP 4X10 (GAUZE/BANDAGES/DRESSINGS) ×3 IMPLANT
DURAPREP 26ML APPLICATOR (WOUND CARE) ×3 IMPLANT
ELECT REM PT RETURN 9FT ADLT (ELECTROSURGICAL) ×3
ELECTRODE REM PT RTRN 9FT ADLT (ELECTROSURGICAL) ×1 IMPLANT
EXTRACTOR VACUUM M CUP 4 TUBE (SUCTIONS) IMPLANT
EXTRACTOR VACUUM M CUP 4' TUBE (SUCTIONS)
GLOVE BIO SURGEON STRL SZ 6.5 (GLOVE) ×2 IMPLANT
GLOVE BIO SURGEONS STRL SZ 6.5 (GLOVE) ×1
GLOVE BIOGEL PI IND STRL 7.0 (GLOVE) ×2 IMPLANT
GLOVE BIOGEL PI INDICATOR 7.0 (GLOVE) ×4
GOWN STRL REUS W/TWL LRG LVL3 (GOWN DISPOSABLE) ×6 IMPLANT
KIT ABG SYR 3ML LUER SLIP (SYRINGE) IMPLANT
NEEDLE HYPO 22GX1.5 SAFETY (NEEDLE) IMPLANT
NEEDLE HYPO 25X5/8 SAFETYGLIDE (NEEDLE) IMPLANT
NS IRRIG 1000ML POUR BTL (IV SOLUTION) ×3 IMPLANT
PACK C SECTION WH (CUSTOM PROCEDURE TRAY) ×3 IMPLANT
PAD OB MATERNITY 4.3X12.25 (PERSONAL CARE ITEMS) ×3 IMPLANT
PENCIL SMOKE EVAC W/HOLSTER (ELECTROSURGICAL) ×3 IMPLANT
STRIP CLOSURE SKIN 1/2X4 (GAUZE/BANDAGES/DRESSINGS) IMPLANT
SUT MON AB 4-0 PS1 27 (SUTURE) ×3 IMPLANT
SUT PLAIN 0 NONE (SUTURE) IMPLANT
SUT PLAIN 2 0 XLH (SUTURE) IMPLANT
SUT VIC AB 0 CT1 36 (SUTURE) ×6 IMPLANT
SUT VIC AB 0 CTX 36 (SUTURE) ×4
SUT VIC AB 0 CTX36XBRD ANBCTRL (SUTURE) ×2 IMPLANT
SUT VIC AB 2-0 CT1 27 (SUTURE) ×2
SUT VIC AB 2-0 CT1 TAPERPNT 27 (SUTURE) ×1 IMPLANT
SYR CONTROL 10ML LL (SYRINGE) IMPLANT
TOWEL OR 17X24 6PK STRL BLUE (TOWEL DISPOSABLE) ×3 IMPLANT
TRAY FOLEY CATH SILVER 14FR (SET/KITS/TRAYS/PACK) IMPLANT

## 2015-04-06 NOTE — Anesthesia Postprocedure Evaluation (Signed)
Anesthesia Post Note  Patient: Charlotte Carter  Procedure(s) Performed: Procedure(s) (LRB): CESAREAN SECTION (N/A)  Patient location during evaluation: PACU Anesthesia Type: Epidural Level of consciousness: oriented and awake and alert Pain management: pain level controlled Vital Signs Assessment: post-procedure vital signs reviewed and stable Respiratory status: spontaneous breathing and respiratory function stable Cardiovascular status: blood pressure returned to baseline and stable Postop Assessment: no headache, no backache, epidural receding and patient able to bend at knees Anesthetic complications: no    Last Vitals:  Filed Vitals:   04/06/15 2002 04/06/15 2012  BP: 166/83 149/82  Pulse: 76 70  Temp:  37.3 C  Resp: 18     Last Pain:  Filed Vitals:   04/06/15 2240  PainSc: 2                  Zenaida Deed

## 2015-04-06 NOTE — Progress Notes (Signed)
Subjective: Doing well, pain controled with epidural, UCs q2-3 min  Anesthesia Epidural   Objective: BP 138/83 mmHg  Pulse 88  Temp(Src) 97.6 F (36.4 C) (Oral)  Resp 18  Ht 5\' 10"  (1.778 m)  Wt 280 lb (127.007 kg)  BMI 40.18 kg/m2  SpO2 100%  LMP 07/09/2014   FHT:  FHR: 130's bpm, variability: moderate,  accelerations:  Present,  decelerations:  Absent UC:   regular, every 2-3 minutes VE:   Dilation: 3 Effacement (%): 70 Station: -3, -2 Exam by:: Sirena Riddle  AROM clear AF ++   Assessment / Plan: Induction of labor due to gestational diabetes,  progressing well on pitocin  Fetal Wellbeing:  Category I Pain Control:  Epidural  Anticipated MOD:  NSVD  Charlotte Carter,MARIE-LYNE 04/06/2015, 10:27 AM

## 2015-04-06 NOTE — Op Note (Signed)
04/05/2015 - 04/06/2015  10:14 PM  PATIENT:  Charlotte Carter  39 y.o. female  PRE-OPERATIVE DIAGNOSIS:  CESAREAN SECTION, arrest of dilation, failed IOL, gest htn, maternal fever  POST-OPERATIVE DIAGNOSIS:  CESAREAN SECTION, arrest of dilation, failed IOL, gest htn, maternal fever  PROCEDURE:  Procedure(s): CESAREAN SECTION (N/A)  SURGEON:  Surgeon(s) and Role:    * Aloha Gell, MD - Primary  PHYSICIAN ASSISTANT:   ASSISTANTSDellis Filbert, MD   ANESTHESIA:   epidural  EBL:  Total I/O In: 2500 [I.V.:2500] Out: 1300 [Urine:600; Blood:700]  BLOOD ADMINISTERED:none  DRAINS: Urinary Catheter (Foley)   LOCAL MEDICATIONS USED:  NONE  SPECIMEN:  Source of Specimen:  placenta  DISPOSITION OF SPECIMEN:  L&D  COUNTS:  YES  TOURNIQUET:  * No tourniquets in log *  DICTATION: .Note written in EPIC  PLAN OF CARE: Admit to inpatient   PATIENT DISPOSITION:  PACU - hemodynamically stable.   Delay start of Pharmacological VTE agent (>24hrs) due to surgical blood loss or risk of bleeding: yes   Findings:  @BABYSEXEBC @ infant,  APGAR (1 MIN): 10   APGAR (5 MINS): 10   APGAR (10 MINS):   Normal uterus, tubes and ovaries, normal placenta. 3VC, clear amniotic fluid  EBL: 700 cc Antibiotics:  3g Ancef Complications: none  Indications: This is a 39 y.o. year-old, G1  At [redacted]w[redacted]d admitted for IOL due to GDM with poor control/ compliance and hypothyroidism. LGA suspected. Pt had IOL with cytotec x 2, pitocin and AROM. IUPC with adequate contractions and pt felt to have entered active labor by contraction strength and frequency prior to receiving epidural. Pt with 10+ hours and no change past 3 cm. Also with maternal fever and hypertension needing escalating doses of IV labetalol, up to 80 mg IV then 10mg  IV hydralizine, Given slow progress, risks of gest htn and chorio, as well as suspected LGA, pt requesting PCS. Risks benefits and alternatives of the procedure were discussed with the  patient who agreed to proceed.   Procedure:  After informed consent was obtained the patient was taken to the operating room where epidural anesthesia was found to be adequate.  She was prepped and draped in the normal sterile fashion in dorsal supine position with a leftward tilt.  A foley catheter was in place.  A Pfannenstiel skin incision was made 2 cm above the pubic symphysis in the midline with the scalpel.  Dissection was carried down with the Bovie cautery until the fascia was reached. The fascia was incised in the midline. The incision was extended laterally with the Mayo scissors. The inferior aspect of the fascial incision was grasped with the Coker clamps, elevated up and the underlying rectus muscles were dissected off sharply. The superior aspect of the fascial incision was grasped with the Coker clamps elevated up and the underlying rectus muscles were dissected off sharply.  The peritoneum was entered bluntly. The peritoneal incision was extended superiorly and inferiorly with good visualization of the bladder. The bladder blade was inserted and palpation was done to assess the fetal position and the location of the uterine vessels. The lower segment of the uterus was incised sharply with the scalpel and extended  bluntly in the cephalo-caudal fashion. The infant was grasped, brought to the incision,  rotated and the infant was delivered with fundal pressure. The nose and mouth were bulb suctioned. The cord was clamped and cut. The infant was handed off to the waiting pediatrician. The placenta was expressed. The uterus was exteriorized.  The uterus was cleared of all clots and debris. The uterine incision was repaired with 0 Vicryl in a running locked fashion.  2 additional figure of 8 sutures were used for hemostasis. A second layer of the same suture was used in an imbricating fashion to obtain excellent hemostasis.  The uterus was then returned to the abdomen, the gutters were cleared of all  clots and debris. The uterine incision was reinspected and found to be hemostatic. The peritoneum was grasped and closed with 2-0 Vicryl in a running fashion. The cut muscle edges and the underside of the fascia were inspected and found to be hemostatic. The fascia was closed with 0 Vicryl in two halves. The subcutaneous tissue was irrigated. Scarpa's layer was closed with a 2-0 plain gut suture. The skin was closed with a 4-0 Monocryl in a single layer. The patient tolerated the procedure well. Sponge lap and needle counts were correct x3 and patient was taken to the recovery room in a stable condition.  Clarkson Rosselli A. 04/06/2015 10:15 PM

## 2015-04-06 NOTE — Transfer of Care (Signed)
Immediate Anesthesia Transfer of Care Note  Patient: Charlotte Carter  Procedure(s) Performed: Procedure(s): CESAREAN SECTION (N/A)  Patient Location: PACU  Anesthesia Type:Epidural  Level of Consciousness: awake  Airway & Oxygen Therapy: Patient Spontanous Breathing  Post-op Assessment: Report given to RN and Post -op Vital signs reviewed and stable  Post vital signs: stable  Last Vitals:  Filed Vitals:   04/06/15 2002 04/06/15 2012  BP: 166/83 149/82  Pulse: 76 70  Temp:  37.3 C  Resp: 18     Complications: No apparent anesthesia complications

## 2015-04-06 NOTE — Brief Op Note (Signed)
04/05/2015 - 04/06/2015  10:14 PM  PATIENT:  Charlotte Carter  39 y.o. female  PRE-OPERATIVE DIAGNOSIS:  CESAREAN SECTION, arrest of dilation, failed IOL, gest htn, maternal fever  POST-OPERATIVE DIAGNOSIS:  CESAREAN SECTION, arrest of dilation, failed IOL, gest htn, maternal fever  PROCEDURE:  Procedure(s): CESAREAN SECTION (N/A)  SURGEON:  Surgeon(s) and Role:    * Aloha Gell, MD - Primary  PHYSICIAN ASSISTANT:   ASSISTANTSDellis Filbert, MD   ANESTHESIA:   epidural  EBL:  Total I/O In: 2500 [I.V.:2500] Out: 1300 [Urine:600; Blood:700]  BLOOD ADMINISTERED:none  DRAINS: Urinary Catheter (Foley)   LOCAL MEDICATIONS USED:  NONE  SPECIMEN:  Source of Specimen:  placenta  DISPOSITION OF SPECIMEN:  L&D  COUNTS:  YES  TOURNIQUET:  * No tourniquets in log *  DICTATION: .Note written in EPIC  PLAN OF CARE: Admit to inpatient   PATIENT DISPOSITION:  PACU - hemodynamically stable.   Delay start of Pharmacological VTE agent (>24hrs) due to surgical blood loss or risk of bleeding: yes

## 2015-04-06 NOTE — Anesthesia Procedure Notes (Signed)
Epidural Patient location during procedure: OB Start time: 04/06/2015 12:30 AM End time: 04/06/2015 12:37 AM  Staffing Anesthesiologist: Lyn Hollingshead Performed by: anesthesiologist   Preanesthetic Checklist Completed: patient identified, surgical consent, pre-op evaluation, timeout performed, IV checked, risks and benefits discussed and monitors and equipment checked  Epidural Patient position: sitting Prep: site prepped and draped and DuraPrep Patient monitoring: continuous pulse ox and blood pressure Approach: midline Location: L3-L4 Injection technique: LOR air  Needle:  Needle type: Tuohy  Needle gauge: 17 G Needle length: 9 cm and 9 Needle insertion depth: 6 cm Catheter type: closed end flexible Catheter size: 19 Gauge Catheter at skin depth: 11 cm Test dose: negative and Other  Assessment Sensory level: T9 Events: blood not aspirated, injection not painful, no injection resistance, negative IV test and no paresthesia  Additional Notes Reason for block:procedure for pain

## 2015-04-06 NOTE — Progress Notes (Signed)
CBG 86. 

## 2015-04-06 NOTE — Anesthesia Preprocedure Evaluation (Signed)
Anesthesia Evaluation  Patient identified by MRN, date of birth, ID band Patient awake    Reviewed: Allergy & Precautions, H&P , NPO status , Patient's Chart, lab work & pertinent test results  Airway Mallampati: II  TM Distance: >3 FB Neck ROM: full    Dental no notable dental hx.    Pulmonary former smoker,    Pulmonary exam normal        Cardiovascular negative cardio ROS Normal cardiovascular exam     Neuro/Psych negative neurological ROS  negative psych ROS   GI/Hepatic negative GI ROS, Neg liver ROS,   Endo/Other  diabetesMorbid obesity  Renal/GU negative Renal ROS     Musculoskeletal   Abdominal (+) + obese,   Peds  Hematology negative hematology ROS (+)   Anesthesia Other Findings   Reproductive/Obstetrics (+) Pregnancy                             Anesthesia Physical Anesthesia Plan  ASA: III  Anesthesia Plan: Epidural   Post-op Pain Management:    Induction:   Airway Management Planned:   Additional Equipment:   Intra-op Plan:   Post-operative Plan:   Informed Consent: I have reviewed the patients History and Physical, chart, labs and discussed the procedure including the risks, benefits and alternatives for the proposed anesthesia with the patient or authorized representative who has indicated his/her understanding and acceptance.     Plan Discussed with:   Anesthesia Plan Comments:         Anesthesia Quick Evaluation

## 2015-04-06 NOTE — Progress Notes (Signed)
S: Doing well, no complaints, pain well controlled with epidural. Pt notes HA resolved, still with cough, now noting fever and "I just feel terrible." Pt notes fatigue, concerned that she will not have energy for 2nd stage. Pt asking for c/s, frustrated with long IOL. No scotomata.   O: BP 149/82 mmHg  Pulse 70  Temp(Src) 99.1 F (37.3 C) (Axillary)  Resp 18  Ht 5\' 10"  (1.778 m)  Wt 127.007 kg (280 lb)  BMI 40.18 kg/m2  SpO2 100%  LMP 07/09/2014   FHT:  FHR: 135s bpm, variability: minimal ,  accelerations:  Present,  decelerations:  Absent UC:   regular, every 3 minutes SVE:   Dilation: 3 Effacement (%): Thick Station: -3, -2 Exam by:: S. Oklesh RN CV: RRR Pulm: expiratory wheeze on R upper lung, no crackles Abd: gravid, no RUQ pain LE: 1+ edema, no clonus, 1+ DTR  A / P:  39 y.o.  Obstetric History   G1   P0   T0   P0   A0   TAB0   SAB0   E0   M0   L0    at [redacted]w[redacted]d IOL for poorly compliant GDM and hypothroid. s/p cytotec x 2, pitocin and AROM. no change in 10+ hours. baby remains high in pelvic with no descent despite adequate MVU for most of the day. Currently MVU not adequate but has been prior.  - hypertension. No evidence PEC but worsening bp's over early evening. Pt needing IV labetalol 10,20,40,80 then 10mg  IV hydralizine. Repeat PEC labs normal and exam stable.  - Fever, unclear if chorio or due to viral sx.  - Cough. No evidence pulmonary edema on CXR. Likely viral.   Fetal Wellbeing:  Category I Pain Control:  Epidural  Anticipated MOD:  PCS. R/B d/w pt. Pt now asking for PCS given plans for this to be only child, long labor, no progress and now with fever and htn. We discussed risk of bleeding (higher given long labor), infection (high given GDM and obesity), damage to surrounding organs. Pt accepts risks. Pt also aware risks of c/s higher the longer she labors, risks of shoulder dystocia, chorio and PEC with continued attempts at vaginal delivery.   Shawne Eskelson  A. 04/06/2015, 9:00 PM

## 2015-04-06 NOTE — Progress Notes (Signed)
S: Pt notes cough and not feeling well, mild HA. labor pain well controlled with epidural  O: BP 156/82 mmHg  Pulse 80  Temp(Src) 98.6 F (37 C) (Oral)  Resp 18  Ht 5\' 10"  (1.778 m)  Wt 127.007 kg (280 lb)  BMI 40.18 kg/m2  SpO2 100%  LMP 07/09/2014  Lying in bed, couging  FHT:  FHR: 130s bpm, variability: moderate,  accelerations:  Present,  decelerations:  Absent UC:   regular, every 2-3 minutes SVE:   Dilation: 3 Effacement (%): 70 Station: -3, -2 Exam by:: Lavoie Clear fluid, IUPC placed  A / P:  39 y.o.  Obstetric History   G1   P0   T0   P0   A0   TAB0   SAB0   E0   M0   L0    at [redacted]w[redacted]d IOL, poorly controlled GDM, hypothryoidism, now s/p cytotec x 2, pitocin and AROM, slow progress, baby still high in pelvis  Fetal Wellbeing:  Category I Pain Control:  Epidural  Anticipated MOD:  Unclear, baby still high  Ottilie Wigglesworth A. 04/06/2015, 1:59 PM

## 2015-04-07 ENCOUNTER — Encounter (HOSPITAL_COMMUNITY): Payer: Self-pay | Admitting: Obstetrics

## 2015-04-07 LAB — CBC
HEMATOCRIT: 31.5 % — AB (ref 36.0–46.0)
HEMOGLOBIN: 10.6 g/dL — AB (ref 12.0–15.0)
MCH: 28 pg (ref 26.0–34.0)
MCHC: 33.7 g/dL (ref 30.0–36.0)
MCV: 83.3 fL (ref 78.0–100.0)
Platelets: 194 10*3/uL (ref 150–400)
RBC: 3.78 MIL/uL — AB (ref 3.87–5.11)
RDW: 14.9 % (ref 11.5–15.5)
WBC: 8.5 10*3/uL (ref 4.0–10.5)

## 2015-04-07 LAB — GLUCOSE, CAPILLARY
GLUCOSE-CAPILLARY: 61 mg/dL — AB (ref 65–99)
GLUCOSE-CAPILLARY: 99 mg/dL (ref 65–99)
GLUCOSE-CAPILLARY: 138 mg/dL — AB (ref 65–99)
Glucose-Capillary: 96 mg/dL (ref 65–99)

## 2015-04-07 NOTE — Progress Notes (Signed)
Pt with fasting CBG of 61. Given 15g of carb in form of one apple juice. Rechecked CBG of 99.

## 2015-04-07 NOTE — Addendum Note (Signed)
Addendum  created 04/07/15 0954 by Vernice Jefferson, CRNA   Modules edited: Clinical Notes   Clinical Notes:  File: [id number]

## 2015-04-07 NOTE — Anesthesia Postprocedure Evaluation (Signed)
Anesthesia Post Note  Patient: Charlotte Carter  Procedure(s) Performed: Procedure(s) (LRB): CESAREAN SECTION (N/A)  Patient location during evaluation: Mother Baby Anesthesia Type: Epidural Level of consciousness: awake and alert Pain management: pain level controlled Vital Signs Assessment: post-procedure vital signs reviewed and stable Respiratory status: spontaneous breathing Cardiovascular status: blood pressure returned to baseline and stable Postop Assessment: no headache, no backache, patient able to bend at knees, no signs of nausea or vomiting and adequate PO intake Anesthetic complications: no    Last Vitals:  Filed Vitals:   04/07/15 0310 04/07/15 0645  BP: 135/71 134/81  Pulse: 60 59  Temp: 37.2 C 37.2 C  Resp: 18 18    Last Pain:  Filed Vitals:   04/07/15 0758  PainSc: 1                  Tai Syfert

## 2015-04-07 NOTE — Lactation Note (Signed)
This note was copied from the chart of Charlotte Carter. Lactation Consultation Note  Patient Name: Charlotte Carter [id number] Date: 04/07/2015 Reason for consult: Initial assessment;Difficult latch Visited with Mom and FOB, baby 55 hrs old.  Mom having difficulty latching baby, nipples short shafted and areola slightly edematous.  Shells in room, no bra worn at present.  Baby bundled up in visitor's arms.  Offered assistance with skin to skin at breast.  Baby woke and assisted with latching baby in football hold on right breast.  Baby able to feed after use of manual breast pump to evert nipple some, and initiate flow.  Several attempts and baby stayed on for 10 minutes, swallowing heard.  On left breast, baby unable to sustain a latch.  It was noted that baby has a tight frenulum and explained to Mom that this may contribute to her soreness, and baby's difficult latch.  Initiated a 20 mm nipple shield, and baby latched fairly well, and swallows heard.  Encouraged Mom to call for assistance at next feeding, as visitors caused Mom to want to hold off from feeding baby.  Mom appears a little overwhelmed at present.  Talked about adding DEBP to plan, but will wait for now. Brochure left with Mom.  Explained to Mom about IP and OP lactation services available to her. Encouraged to place baby skin to skin and feed baby often on cue. To follow up in am, to call prn for assistance.  Consult Status Consult Status: Follow-up Date: 04/08/15 Follow-up type: In-patient    Broadus John 04/07/2015, 2:26 PM

## 2015-04-07 NOTE — Progress Notes (Signed)
POSTOPERATIVE DAY # 1 S/P CS - arrest of labor / gestational hypertension  S:         Reports feeling ok - really tired, hungry and sore             Tolerating po intake /no nausea this am since breakfast /no vomiting /no flatus /no BM             Bleeding is light             Pain controlled with long-acting narcotic             Not ambulating yet / not OOB yet  Newborn breast feeding     O:  VS: BP 134/81 mmHg  Pulse 59  Temp(Src) 98.9 F (37.2 C) (Oral)  Resp 18  Ht 5\' 10"  (1.778 m)  Wt 127.007 kg (280 lb)  BMI 40.18 kg/m2  SpO2 99%  LMP 07/09/2014  Breastfeeding? Unknown   BP: 134/81 - 135/71 - 134/66 - 120/66   LABS:               Recent Labs  04/06/15 1620 04/07/15 0625  WBC 9.3 8.5  HGB 12.3 10.6*  PLT 211 194               Bloodtype: --/--/A POS, A POS (12/18 0800)  Rubella: Immune (05/27 0000)                                             I&O: Intake/Output      12/19 0701 - 12/20 0700 12/20 0701 - 12/21 0700   P.O. 480    I.V. (mL/kg) 4225.7 (33.3)    Other 1150    Total Intake(mL/kg) 5855.7 (46.1)    Urine (mL/kg/hr) 1500 (0.5)    Blood 900 (0.3)    Total Output 2400     Net +3455.7                       Physical Exam:             Alert and Oriented X3  Lungs: Clear and unlabored  Heart: regular rate and rhythm / no mumurs  Abdomen: soft, non-tender, slightly distended, active BS, pendulous             Fundus: firm, non-tender, Ueven             Dressing intact             Foley: draining dark yellow urine  Perineum: intact  Lochia: light  Extremities: 2+ pedal edema, no calf pain or tenderness, SCD in place  A:        POD # 1 S/P CS            Gestational hypertension - stable BP  P:        Routine postoperative care              Advance post-op care   Artelia Laroche CNM, MSN, [facility] 04/07/2015, 8:45 AM

## 2015-04-08 ENCOUNTER — Inpatient Hospital Stay (HOSPITAL_COMMUNITY)
Admission: AD | Admit: 2015-04-08 | Payer: BLUE CROSS/BLUE SHIELD | Source: Ambulatory Visit | Admitting: Obstetrics & Gynecology

## 2015-04-08 LAB — GLUCOSE, CAPILLARY
GLUCOSE-CAPILLARY: 109 mg/dL — AB (ref 65–99)
GLUCOSE-CAPILLARY: 117 mg/dL — AB (ref 65–99)
Glucose-Capillary: 177 mg/dL — ABNORMAL HIGH (ref 65–99)

## 2015-04-08 MED ORDER — GUAIFENESIN ER 600 MG PO TB12
600.0000 mg | ORAL_TABLET | Freq: Two times a day (BID) | ORAL | Status: DC
  Administered 2015-04-08 – 2015-04-09 (×3): 600 mg via ORAL
  Filled 2015-04-08 (×5): qty 1

## 2015-04-08 MED ORDER — SALINE SPRAY 0.65 % NA SOLN
1.0000 | NASAL | Status: DC | PRN
  Administered 2015-04-08: 1 via NASAL
  Filled 2015-04-08: qty 44

## 2015-04-08 NOTE — Lactation Note (Signed)
This note was copied from the chart of Charlotte Madgelene Canseco. Lactation Consultation Note Follow up at 43 hours post frenotomy.  Baby is very sleepy and appears jaundice in color, MBU Rn at bedside and aware.  Mom reluctant to continue NS use, but baby will not maintain hold or suck.  When sucking on gloved finger baby does not extend tongue past lower gumline.  Encouraged mom to work on Catering manager.  #20NS applied by mom, baby sucked a few times, but does not maintain even with stimulation.  LC attempted hand expression on opposite breast with a small drop collected.  Mom continues to have sore nipples from previous nipple trauma and is discouraged with breastfeeding.   Encouraged mom to pump (2nd time) and spoon feed back to the baby.  IF baby awakens for feeding use NS.  IF baby is still too sleepy to feed, mom needs to  supplement with alimentum per feeding guidelines.  OK to use bottle nipple for this baby with NS use.  Mom to pump q3 for 8 sessions in 24 hours to induce lactation.  Report given to [facility] RN to follow up plan with mom.   Patient Name: Charlotte Carter [id number] Date: 04/08/2015 Reason for consult: Follow-up assessment;Breast/nipple pain;Difficult latch (frenulum revision)   Maternal Data Has patient been taught Hand Expression?: Yes  Feeding Feeding Type: Breast Fed Length of feed: 15 min  LATCH Score/Interventions Latch: Too sleepy or reluctant, no latch achieved, no sucking elicited. Intervention(s): Skin to skin;Teach feeding cues;Waking techniques Intervention(s): Adjust position;Assist with latch;Breast massage;Breast compression  Audible Swallowing: None Intervention(s): Skin to skin;Hand expression  Type of Nipple: Flat Intervention(s): Double electric pump  Comfort (Breast/Nipple): Filling, red/small blisters or bruises, mild/mod discomfort  Problem noted: Mild/Moderate discomfort Interventions (Mild/moderate discomfort): Pre-pump if  needed;Post-pump  Hold (Positioning): Assistance needed to correctly position infant at breast and maintain latch. Intervention(s): Breastfeeding basics reviewed;Support Pillows;Position options;Skin to skin  LATCH Score: 3  Lactation Tools Discussed/Used Tools: Nipple Shields Nipple shield size: 20 Breast pump type: Double-Electric Breast Pump   Consult Status Consult Status: Follow-up Date: 04/08/15 Follow-up type: In-patient    Justice Britain 04/08/2015, 5:08 PM

## 2015-04-08 NOTE — Progress Notes (Signed)
Unable to obtain 2 hour pp CBG because patient did not eat breakfast. Pt finally ate her lunch at 1245 and CBG obtained at 1445 resulted documented, see flow sheet section of chart. Modena Nunnery, RN

## 2015-04-08 NOTE — Lactation Note (Signed)
This note was copied from the chart of Charlotte Elaysha Luciano. Lactation Consultation Note  Baby has anterior lingual frenulum contributing to limited tongue movement.  Visible toward tip of tongue.  Ped MD aware. Mother's nipple very sore.  Mother has been using #20NS to latch baby.  Did not see any colostrum in NS after feeding. Attempted latching without NS but mother winces in pain. Set up DEBP.  Reviewed milk storage, cleaning and syringe and spoon feeding. Suggest mother post pump 4-6 times a day for 10-15 min with DEBP and give baby back volume pumped. Mother was able to tolerate 10 min feeding with #20NS on each breast. Gave baby approx 4 ml of expressed colostrum via finger syringe feed.   Encouraged mother to continue hand expression.    Patient Name: Charlotte Carter [id number] Date: 04/08/2015 Reason for consult: Follow-up assessment   Maternal Data    Feeding Feeding Type: Breast Fed Length of feed: 12 min  LATCH Score/Interventions Latch: Grasps breast easily, tongue down, lips flanged, rhythmical sucking.  Audible Swallowing: A few with stimulation Intervention(s): Hand expression  Type of Nipple: Flat Intervention(s): Double electric pump  Comfort (Breast/Nipple): Filling, red/small blisters or bruises, mild/mod discomfort  Problem noted: Cracked, bleeding, blisters, bruises  Hold (Positioning): No assistance needed to correctly position infant at breast.  LATCH Score: 7  Lactation Tools Discussed/Used Tools: Nipple Shields Nipple shield size: 20 Breast pump type: Double-Electric Breast Pump Initiated by:: Vivianne Master RN Date initiated:: 04/08/15   Consult Status Consult Status: Follow-up Date: 04/09/15 Follow-up type: In-patient    Vivianne Master [facility] 04/08/2015, 12:03 PM

## 2015-04-08 NOTE — Progress Notes (Addendum)
Patient ID: Charlotte Carter, female   DOB: 04-Sep-1975, 39 y.o.   MRN: [id number] Subjective: S/P Primary Cesarean Delivery d/t failed IOL, arrest of dilation, maternal fever, and GHTN   POD# 2 Information for the patient's newborn:  Perel, Alphin Girl Ravynn [id number]  female  Reports not feeling. Feeling very congested and coughing a lot - requesting something for the sx's.  Feeding: breast Patient reports tolerating PO.  Breast symptoms: partially flat nipples - having difficulties with latching / LC to assist more today Pain controlled with ibuprofen (OTC) and narcotic analgesics including Percocet Denies HA/SOB/C/P/N/V/dizziness. Flatus present. No BM. She reports vaginal bleeding as normal, without clots.  She is ambulating, urinating without difficult.     Objective:   VS:  Filed Vitals:   04/07/15 1445 04/07/15 1741 04/07/15 2202 04/08/15 0432  BP: 144/67 130/67 127/71 138/60  Pulse: 75 82 74 84  Temp: 99.7 F (37.6 C) 99 F (37.2 C) 98.8 F (37.1 C) 99.2 F (37.3 C)  TempSrc: Axillary Axillary Oral Oral  Resp: 18 18 20 20   Height:      Weight:      SpO2: 100%        Intake/Output Summary (Last 24 hours) at 04/08/15 1034 Last data filed at 04/07/15 2201  Gross per 24 hour  Intake      0 ml  Output   2850 ml  Net  -2850 ml        Recent Labs  04/06/15 1620 04/07/15 0625  WBC 9.3 8.5  HGB 12.3 10.6*  HCT 37.6 31.5*  PLT 211 194     Blood type: A POS (12/18 0800)  Rubella: Immune (05/27 0000)     Physical Exam:   General: alert, cooperative, no distress and moderately obese  Abdomen: soft, nontender, normal bowel sounds  Incision: clean, dry, intact and skin well-approximated with sutures  Uterine Fundus: firm, 2 FB below umbilicus, nontender  Lochia: minimal  Ext: extremities normal, atraumatic, no cyanosis or edema, edema trace and Homans sign is negative, no sign of DVT   Assessment/Plan: 39 y.o.   POD# 2.  S/P Cesarean Delivery.  Indications:  failed IOL, arrest of dilation, maternal fever, and GHTN                Principal Problem:   Postpartum care following cesarean delivery (12/19) Active Problems:   Gestational diabetes requiring insulin   Gestational hypertension  Doing well, stable.               Regular diet as tolerated Ambulate Routine post-op care Start Mucinex 1 BID Saline Spray in each nare prn congestion Increase water intake to counteract decongestant decreasing milk supply Anticipate D/C home tomorrow  Laury Deep, M, MSN, CNM 04/08/2015, 10:34 AM

## 2015-04-09 LAB — GLUCOSE, CAPILLARY
GLUCOSE-CAPILLARY: 105 mg/dL — AB (ref 65–99)
Glucose-Capillary: 161 mg/dL — ABNORMAL HIGH (ref 65–99)
Glucose-Capillary: 120 mg/dL — ABNORMAL HIGH (ref 65–99)

## 2015-04-09 MED ORDER — HYDROCOD POLST-CPM POLST ER 10-8 MG/5ML PO SUER: 5.0000 mL | Freq: Every evening | ORAL | Status: DC | PRN

## 2015-04-09 MED ORDER — AZITHROMYCIN 250 MG PO TABS: ORAL_TABLET | ORAL | Status: DC

## 2015-04-09 MED ORDER — GUAIFENESIN ER 600 MG PO TB12: 600.0000 mg | ORAL_TABLET | Freq: Two times a day (BID) | ORAL | Status: DC

## 2015-04-09 MED ORDER — SALINE SPRAY 0.65 % NA SOLN: 1.0000 | NASAL | Status: DC | PRN

## 2015-04-09 MED ORDER — IBUPROFEN 800 MG PO TABS: 800.0000 mg | ORAL_TABLET | Freq: Three times a day (TID) | ORAL | Status: DC

## 2015-04-09 MED ORDER — OXYCODONE-ACETAMINOPHEN 5-325 MG PO TABS: 1.0000 | ORAL_TABLET | ORAL | Status: DC | PRN

## 2015-04-09 MED ORDER — METFORMIN HCL 500 MG PO TABS
500.0000 mg | ORAL_TABLET | Freq: Two times a day (BID) | ORAL | Status: DC
  Administered 2015-04-09: 500 mg via ORAL
  Filled 2015-04-09 (×3): qty 1

## 2015-04-09 MED ORDER — AZITHROMYCIN 500 MG PO TABS
500.0000 mg | ORAL_TABLET | Freq: Once | ORAL | Status: AC
  Administered 2015-04-09: 500 mg via ORAL
  Filled 2015-04-09: qty 1

## 2015-04-09 MED ORDER — METFORMIN HCL 500 MG PO TABS: 500.0000 mg | ORAL_TABLET | Freq: Every day | ORAL | Status: DC

## 2015-04-09 NOTE — Progress Notes (Signed)
POSTOPERATIVE DAY # 3 S/P CS  S:         Reports feeling worsening cough and congestion             Tolerating po intake / no nausea / no vomiting / + flatus / no BM             Bleeding is light             Pain controlled with motrin and percocet             Up ad lib / ambulatory/ voiding QS  Newborn breast feeding   O:    VSS / tmax 100.6 last night 1805   BP 169/76 mmHg  Pulse 83  Temp(Src) 97.8 F (36.6 C) (Axillary)  Resp 20  Ht 5\' 10"  (1.778 m)  Wt 127.007 kg (280 lb)  BMI 40.18 kg/m2  SpO2 100%  LMP 07/09/2014  Breastfeeding? Unknown  BP: 169/76 - 144/66 - 156/89 - 138/60   LABS:               Recent Labs  04/06/15 1620 04/07/15 0625  WBC 9.3 8.5  HGB 12.3 10.6*  PLT 211 194               Bloodtype: --/--/A POS, A POS (12/18 0800)  Rubella: Immune (05/27 0000)                                Physical Exam:             Alert and Oriented X3  Lungs: unlabored with non-productive cough / no SOB                         bilateral expiratory wheezes with brochial inspiratory and expiratory wheezes  Heart: regular rate and rhythm / no mumurs  Abdomen: soft, non-tender, non-distended, active BS             Fundus: firm, non-tender, Ueven             Dressing intact honeycomb              Incision:  approximated with suture / no erythema / no ecchymosis / no drainage  Perineum: intact  Lochia: light  Extremities: 1+ pedal edema, no calf pain or tenderness, negative Homans  A:        POD # 3 S/P CS            Pre-existing glucose intolerance / morbid obesity with elevated A1c & elevated FBS            Acute URI with bronchitis  P:        Routine postoperative care              Continue low carb diet and start metformin 500 at HS & continue monitoring CBG at home              Z-pak for URI and bronchitis - signs to call (fever / worsening symptoms / any SOB)              Recheck in 5-6 days at [place], [place], MSN, [facility] 04/09/2015, 10:34  AM

## 2015-04-09 NOTE — Discharge Instructions (Signed)
Take Mucinex twice daily  X 1 week Saline spray for congestion OK [facility] Dimetapp  Every 8 hours for congestion and cough - safe with breastfeeding (contains NO sudafed)   CALL [facility] of fever > 101 / worsening cough / any shortness of breath

## 2015-04-09 NOTE — Discharge Summary (Signed)
POSTOPERATIVE DISCHARGE SUMMARY:  Patient ID: Charlotte Carter MRN: [id number] DOB/AGE: 07/26/1975 39 y.o.  Admit date: 04/05/2015 Admission Diagnoses: 38.3 weeks / pre-existing glucose intolerance / GDMa2 insulin requiring / gestational hypertension / morbid obesity  Discharge date:   Discharge Diagnoses: POD s/p primary CS (arrest of labor - maternal fever - gestational hypertension) / acute URI with bronchitis / glucose intolerance & GMDa2 delivered  Prenatal history: G1P1001   EDC : 04/17/2015, Alternate EDD Entry  Prenatal care at [facility]  Primary provider : Dellis Filbert Prenatal course complicated by morbid obesity / AMA / pre-existing glucose intolerance with elevated A1c / GDMa2 / gestational hypertension  Prenatal Labs: ABO, Rh: --/--/A POS, A POS (12/18 0800)  Antibody: NEG (12/18 0800) Rubella: Immune (05/27 0000)   RPR: Non Reactive (12/18 0800)  HBsAg: Negative (05/27 0000)  HIV: Non-reactive (05/27 0000)  GBS: Negative (11/25 0000)   Medical / Surgical History :  Past medical history:  Past Medical History  Diagnosis Date  . Thyroid disease   . Rosacea   . Hyperlipidemia   . GDM (gestational diabetes mellitus)   . Hypothyroidism   . Hx of varicella   . Vaginal Pap smear, abnormal     Past surgical history:  Past Surgical History  Procedure Laterality Date  . Wisdom tooth extraction    . Cesarean section N/A 04/06/2015    Procedure: CESAREAN SECTION;  Surgeon: Aloha Gell, MD;  Location: [facility];  Service: Obstetrics;  Laterality: N/A;    Family History:  Family History  Problem Relation Age of Onset  . Diabetes Mother   . Hypertension Mother   . Hyperlipidemia Mother   . Arthritis Mother   . Alcohol abuse Mother   . Hypothyroidism Mother   . Hypertension Father   . Hyperlipidemia Father   . Arthritis Father   . Alcohol abuse Father   . Heart disease Father   . Hypothyroidism Father   . Asthma Sister   . Hypertension  Sister   . Diabetes Maternal Grandmother     Social History:  reports that she has quit smoking. She does not have any smokeless tobacco history on file. She reports that she drinks about 10.5 oz of alcohol per week. She reports that she does not use illicit drugs.  Allergies: Review of patient's allergies indicates no known allergies.   Current Medications at time of admission:  Prior to Admission medications   Medication Sig Start Date End Date Taking? Authorizing Provider  glyBURIDE (DIABETA) 2.5 MG tablet Take 5 mg by mouth 2 (two) times daily with a meal.   Yes Historical Provider, MD  levothyroxine (SYNTHROID, LEVOTHROID) 150 MCG tablet Take 1 tablet (150 mcg total) by mouth daily. 01/29/15  Yes Alycia Rossetti, MD  Multiple Vitamin (MULTIVITAMIN) tablet Take 1 tablet by mouth daily.   Yes Historical Provider, MD  Multiple Vitamins-Minerals (ZINC PO) Take 1 tablet by mouth daily.   Yes Historical Provider, MD  Omega-3 Fatty Acids (FISH OIL) 1200 MG CAPS Take 1 capsule by mouth 2 (two) times daily.    Yes Historical Provider, MD  Prenatal Multivit-Min-Fe-FA (PRENATAL VITAMINS PO) Take 1 tablet by mouth daily.    Yes Historical Provider, MD  vitamin C (ASCORBIC ACID) 500 MG tablet Take 500 mg by mouth daily.   Yes Historical Provider, MD  azithromycin (ZITHROMAX) 250 MG tablet One daily x 6 days 04/09/15   Artelia Laroche, CNM  chlorpheniramine-HYDROcodone [facility]) 10-8 MG/5ML SUER Take 5  mLs by mouth at bedtime as needed for cough. 04/09/15   Artelia Laroche, CNM  clotrimazole-betamethasone (LOTRISONE) cream Apply 1 application topically 2 (two) times daily. Patient not taking: Reported on 04/05/2015 03/11/14   Alycia Rossetti, MD  guaiFENesin (MUCINEX) 600 MG 12 hr tablet Take 1 tablet (600 mg total) by mouth 2 (two) times daily. 04/09/15   Artelia Laroche, CNM  ibuprofen (ADVIL,MOTRIN) 800 MG tablet Take 1 tablet (800 mg total) by mouth every 8 (eight) hours. 04/09/15   Artelia Laroche, CNM  mefloquine (LARIAM) 250 MG tablet Take 1 tablet (250 mg total) by mouth every 7 (seven) days. Patient not taking: Reported on 04/05/2015 09/24/14   Alycia Rossetti, MD  metFORMIN (GLUCOPHAGE) 500 MG tablet Take 1 tablet (500 mg total) by mouth at bedtime. 04/09/15   Artelia Laroche, CNM  mupirocin ointment (BACTROBAN) 2 % Apply 1 application topically 2 (two) times daily. To affected areas as needed Patient not taking: Reported on 04/05/2015 03/11/14   Alycia Rossetti, MD  Norgestimate-Ethinyl Estradiol Triphasic (TRI-SPRINTEC) 0.18/0.215/0.25 MG-35 MCG tablet Take 1 tablet by mouth daily. Patient not taking: Reported on 04/05/2015 06/19/13   Alycia Rossetti, MD  oxyCODONE-acetaminophen (PERCOCET/ROXICET) 5-325 MG tablet Take 1 tablet by mouth every 4 (four) hours as needed (for pain scale 4-7). 04/09/15   Artelia Laroche, CNM  sodium chloride (OCEAN) 0.65 % SOLN nasal spray Place 1 spray into both nostrils as needed for congestion. 04/09/15   Artelia Laroche, CNM    Intrapartum Course:  Admit for induction labor with labor progression to 3cm dilation with protracted labor curve Complicated by: maternal fever and slow labor progression and morbid obesity Interventions required: cesarean section - primary  Procedures: Cesarean section delivery on 04/06/2015 with delivery of female newborn by Dr Pamala Hurry and Dr Dellis Filbert   See operative report for further details APGAR (1 MIN): 10   APGAR (5 MINS): 10    Postoperative / postpartum course:   discharge on POD 3 Complicated by: elevated glucose levels - started metformin 500 QHS with continue carb modified diet and CBG at home                             URI with acute bronchitis - started on Z-pak with supportive care and close F/U for risk for pneumonia  Discharge Instructions:  Discharged Condition: stable  Activity: pelvic rest and postoperative restrictions x 2   Diet: carb modified  Medications:    Medication List    STOP  taking these medications        glyBURIDE 2.5 MG tablet  Commonly known as:  DIABETA     mefloquine 250 MG tablet  Commonly known as:  LARIAM     Norgestimate-Ethinyl Estradiol Triphasic 0.18/0.215/0.25 MG-35 MCG tablet  Commonly known as:  TRI-SPRINTEC      TAKE these medications        azithromycin 250 MG tablet  Commonly known as:  ZITHROMAX  One daily x 6 days     chlorpheniramine-HYDROcodone 10-8 MG/5ML Suer  Commonly known as:  TUSSIONEX PENNKINETIC ER  Take 5 mLs by mouth at bedtime as needed for cough.     clotrimazole-betamethasone cream  Commonly known as:  LOTRISONE  Apply 1 application topically 2 (two) times daily.     Fish Oil 1200 MG Caps  Take 1 capsule by mouth 2 (two) times daily.     guaiFENesin 600 MG 12 hr tablet  Commonly known as:  MUCINEX  Take 1 tablet (600 mg total) by mouth 2 (two) times daily.     ibuprofen 800 MG tablet  Commonly known as:  ADVIL,MOTRIN  Take 1 tablet (800 mg total) by mouth every 8 (eight) hours.     levothyroxine 150 MCG tablet  Commonly known as:  SYNTHROID, LEVOTHROID  Take 1 tablet (150 mcg total) by mouth daily.     metFORMIN 500 MG tablet  Commonly known as:  GLUCOPHAGE  Take 1 tablet (500 mg total) by mouth at bedtime.     multivitamin tablet  Take 1 tablet by mouth daily.     mupirocin ointment 2 %  Commonly known as:  BACTROBAN  Apply 1 application topically 2 (two) times daily. To affected areas as needed     oxyCODONE-acetaminophen 5-325 MG tablet  Commonly known as:  PERCOCET/ROXICET  Take 1 tablet by mouth every 4 (four) hours as needed (for pain scale 4-7).     PRENATAL VITAMINS PO  Take 1 tablet by mouth daily.     sodium chloride 0.65 % Soln nasal spray  Commonly known as:  OCEAN  Place 1 spray into both nostrils as needed for congestion.     vitamin C 500 MG tablet  Commonly known as:  ASCORBIC ACID  Take 500 mg by mouth daily.     ZINC PO  Take 1 tablet by mouth daily.         Wound Care: keep clean and dry  Postpartum Instructions: [facility] discharge booklet - instructions reviewed  Discharge to: Home  Follow up :  [facility] on 04/14/2015 for interval visit with CNM or Dr Dellis Filbert for recheck on bronchitis / CBG review / incision check to remove honeycomb [facility] in 6 weeks for routine postpartum visit with Dr Dellis Filbert                Signed: Artelia Laroche CNM, MSN, [facility] 04/09/2015, 10:54 AM

## 2015-04-09 NOTE — Lactation Note (Addendum)
This note was copied from the chart of Charlotte Carter. Lactation Consultation Note  Patient Name: Charlotte Carter Date: 04/09/2015 Reason for consult: Follow-up assessment;Hyperbilirubinemia;Breast/nipple pain;Infant weight loss  Baby is 48 hours old , S/P anterior frenotomy 12/21. Baby has received EBM  And formula from  a bottle all night. Per mom - not feeling well herself and she mentioned the doctor feels she has bronchitis and will be going home on  Antibiotics.  Mom willing to latch the baby at the breast. LC noted semi flat nipples - semi compressible areolas. Breast 1st , hand expressed, and pre-pump to make the base of the nipple more elastic for a deeper latch. @ consult baby latched to different times 15 mins 1st latch - multiply swallows, increased with breast compressions. 2nd latch - added 96F feeding SNS and baby latched deeper 1st and then the SNS tube inserted into the side of the baby's mouth. Baby took 22 ml of EBM well with the SNS. Mom and dad seemed very impressed at how well the SNS kept baby in a consistent pattern. Baby released and was satisfied, mom post pumped there breast 20 mins.  Mom instructed on use shells, dad on the cleaning of pump pieces, SNS tube. Written BF plan discussed and copy given to mom and dad.  F.U appt. Next Thursday 12/29 at 4pm , written reminder given to mom  [place], naps, plenty fluids, especially water, nutritious snacks and meals. Feedings - every 2 1/2 - 3 hours and with feeding cues. Cluster feeding is normal, especially with feeding cues. Early growth spurt 7-10 days, 3 weeks , 6 weeks. Steps for latching -  Stressing need for areola to be like a thin sandwich for a deep latch , firm support , and breast compressions until swallows.  After baby latched - insert SNS ( 5 F feeding in the side of the mouth, ( 24 -30 ml EBM or formula ), all on the 1st breast  Soften 1st breast well prior to offering  the 2nd breast, if the baby doesn't latch 2nd breast , release down by hand expressing or using the hand pump. Engorgement prevention and tx  Full = good sign: if breast are heading towards snug firm to hard - ice 15 -20 mins and then steps for latching.  Once the Jaundice decreases , weight increases, , can decrease supplement.     Maternal Data Has patient been taught Hand Expression?: Yes  Feeding Feeding Type: Breast Milk Length of feed: 20 min  LATCH Score/Interventions Latch: Grasps breast easily, tongue down, lips flanged, rhythmical sucking. Intervention(s): Skin to skin;Teach feeding cues;Waking techniques Intervention(s): Adjust position;Assist with latch;Breast massage;Breast compression  Audible Swallowing: Spontaneous and intermittent  Type of Nipple: Everted at rest and after stimulation Intervention(s): Reverse pressure  Comfort (Breast/Nipple): Filling, red/small blisters or bruises, mild/mod discomfort  Problem noted: Filling Interventions (Filling): Reverse pressure  Hold (Positioning): Assistance needed to correctly position infant at breast and maintain latch. Intervention(s): Breastfeeding basics reviewed;Support Pillows;Position options;Skin to skin  LATCH Score: 8  Lactation Tools Discussed/Used Tools: Shells;Pump;96F feeding tube / Syringe Shell Type: Inverted Breast pump type: Double-Electric Breast Pump (for post  pumping ) WIC Program: No   Consult Status Consult Status: Follow-up Date: 04/16/15 (4 pm , appt. reminder given to mom and dad ) Follow-up type: Out-patient    Myer Haff 04/09/2015, 12:55 PM

## 2015-04-16 ENCOUNTER — Ambulatory Visit (HOSPITAL_COMMUNITY)
Admission: RE | Admit: 2015-04-16 | Discharge: 2015-04-16 | Disposition: A | Discharge status: Home / Self Care | Payer: BLUE CROSS/BLUE SHIELD | Source: Ambulatory Visit | Attending: Obstetrics | Admitting: Obstetrics

## 2015-04-16 NOTE — Lactation Note (Signed)
Lactation Consult  Mother's reason for visit:  Follow up from hospital Visit Type:  Feeding assessment Appointment Notes:  Frenotomy in hospital Consult:  Initial Lactation Consultant:  Ave Filter  ________________________________________________________________________  Baby's Name: Charlotte Carter Date of Birth: 04/06/2014 [facility]: [facility] Gender: female Gestational Age: [redacted]w[redacted]d (At Birth) Birth Weight: 8 lb 0.8 oz (3650 g) Weight at Discharge: Weight: 7 lb 7.2 oz (3380 g)Date of Discharge: 04/09/2015 Filed Weights   04/06/15 2345 04/08/15 0030 04/09/15 0100  Weight: 8 lb 0.8 oz (3650 g) 7 lb 9.2 oz (3435 g) 7 lb 7.2 oz (3380 g)    Weight today: 8-6.3     ________________________________________________________________________  Mother's Name: Charlotte Carter Type of delivery:  C/S Breastfeeding Experience:  FIRST BABY Maternal Medical Conditions:  Infertility   ________________________________________________________________________  Breastfeeding History (Post Discharge)  Frequency of breastfeeding:  1-3 TIMES/DAY Duration of feeding:  30 MINUTES/1 HOUR  Supplementation  Formula:  Volume 60-79ml Frequency:  EVERY 3 HOURS       Brand: Similac  Breastmilk:  Volume 12ml Frequency:  EVERY 3 HOURS   Method:  Bottle,   Pumping  Type of pump:  Symphony Frequency:  2-4 TIMES/DAY Volume:  30-159ml  Infant Intake and Output Assessment  Voids:  6-8in 24 hrs.  Color:  Clear yellow Stools:  6-8 in 24 hrs.  Color:  Yellow  ________________________________________________________________________  Maternal Breast Assessment  Breast:  Filling Nipple:  Erect/SHORT Pain level:  3 Pain interventions:  Expressed breast milk  _______________________________________________________________________ Feeding Assessment/Evaluation:  Mom and 75 day old infant here for feeding assessment.  Baby had a frenotomy  done on day 2 in the hospital.  Frenotomy site healed well and baby has good movement of tongue.  Mom has been mostly Bottle feeding due to soreness and lack of confidence.  Mom also recovering from bronchitis.  Breasts are full and nipples have a healing small scabs.  Mom has been using cradle hold at home.  Baby is gaining well and beyond birth weight. Observed mom with latch attempt using cradle hold with difficulty.  Assisted with better positioning and changing to a cross cradle hold.  Milk dripping from breast.  Baby latched well and nursed actively with good stimulation and breast Massage needed.  Many swallows heard.  Mom was independent on second breast with latch.  Baby transferred 88 mls and was content and relaxed after feeding.  Many questions answered.  Mom states she feels much more confident.  Plan reviewed With mom and she verbalizes understanding.  Encouraged mom to put baby to breast as often as possible. Stressed importance of supply and demand.  Instructed to pump both breasts if baby receives a bottle.  Encouraged to call with questions or For follow up appointment.  Initial feeding assessment:  Infant's oral assessment:  WNL  Positioning:  Cross cradle Right breast  LATCH documentation:  Latch:  2 = Grasps breast easily, tongue down, lips flanged, rhythmical sucking.  Audible swallowing:  2 = Spontaneous and intermittent  Type of nipple:  2 = Everted at rest and after stimulation  Comfort (Breast/Nipple):  2 = Soft / non-tender  Hold (Positioning):  1 = Assistance needed to correctly position infant at breast and maintain latch  LATCH score:  9  Attached assessment:  Deep  Lips flanged:  No.  Lips untucked:  Yes.    Suck assessment:  Displays both     Am Pre-feed weight:  3806g Post-feed weight:  3860 g  amount transferred:  54 ml Amount supplemented:  0 ml  Additional Feeding Assessment -   Infant's oral assessment:  WNL  Positioning:  Cross cradle Left  breast  LATCH documentation:  Latch:  2 = Grasps breast easily, tongue down, lips flanged, rhythmical sucking.  Audible swallowing:  2 = Spontaneous and intermittent  Type of nipple:  2 = Everted at rest and after stimulation  Comfort (Breast/Nipple):  2 = Soft / non-tender  Hold (Positioning):  2 = No assistance needed to correctly position infant at breast  LATCH score:  10  Attached assessment:  Deep  Lips flanged:  No.  Lips untucked:  Yes.    Suck assessment:  Displays both     Pre-feed weight:  3860 g Post-feed weight:  3894 gAmount transferred:  34 ml Amount supplemented:  0 ml     Total amount transferred:  88 ml Total supplement given:  0 ml

## 2015-04-18 ENCOUNTER — Other Ambulatory Visit: Payer: Self-pay | Admitting: Family Medicine

## 2015-05-15 ENCOUNTER — Encounter: Payer: Self-pay | Admitting: Family Medicine

## 2015-05-18 MED ORDER — METFORMIN HCL 500 MG PO TABS: 500.0000 mg | ORAL_TABLET | Freq: Every day | ORAL | Status: DC

## 2015-07-15 ENCOUNTER — Encounter: Payer: Self-pay | Admitting: Family Medicine

## 2015-07-15 DIAGNOSIS — E039 Hypothyroidism, unspecified: Secondary | ICD-10-CM

## 2015-07-15 DIAGNOSIS — Z0000 Encounter for general adult medical examination without abnormal findings: Secondary | ICD-10-CM

## 2015-07-15 DIAGNOSIS — E785 Hyperlipidemia, unspecified: Secondary | ICD-10-CM

## 2015-07-29 ENCOUNTER — Other Ambulatory Visit: Payer: Self-pay | Admitting: Family Medicine

## 2015-07-29 LAB — COMPLETE METABOLIC PANEL WITH GFR
ALT: 18 U/L (ref 6–29)
AST: 17 U/L (ref 10–30)
Albumin: 4.3 g/dL (ref 3.6–5.1)
Alkaline Phosphatase: 61 U/L (ref 33–115)
BILIRUBIN TOTAL: 0.5 mg/dL (ref 0.2–1.2)
BUN: 14 mg/dL (ref 7–25)
CALCIUM: 9.4 mg/dL (ref 8.6–10.2)
CHLORIDE: 105 mmol/L (ref 98–110)
CO2: 28 mmol/L (ref 20–31)
CREATININE: 0.49 mg/dL — AB (ref 0.50–1.10)
GFR, Est Non African American: >89 mL/min (ref >=60)
Glucose, Bld: 96 mg/dL (ref 70–99)
Potassium: 4.5 mmol/L (ref 3.5–5.3)
Sodium: 141 mmol/L (ref 135–146)
TOTAL PROTEIN: 6.8 g/dL (ref 6.1–8.1)

## 2015-07-29 LAB — CBC WITH DIFFERENTIAL/PLATELET
BASOS PCT: 1 %
Basophils Absolute: 58 cells/uL (ref 0–200)
EOS ABS: 116 {cells}/uL (ref 15–500)
Eosinophils Relative: 2 %
HEMATOCRIT: 41.5 % (ref 35.0–45.0)
Hemoglobin: 13.7 g/dL (ref 12.0–15.0)
LYMPHS PCT: 35 %
Lymphs Abs: 2030 cells/uL (ref 850–3900)
MCH: 27.8 pg (ref 27.0–33.0)
MCHC: 33 g/dL (ref 32.0–36.0)
MCV: 84.2 fL (ref 80.0–100.0)
MONO ABS: 348 {cells}/uL (ref 200–950)
MONOS PCT: 6 %
MPV: 9.9 fL (ref 7.5–12.5)
NEUTROS PCT: 56 %
Neutro Abs: 3248 cells/uL (ref 1500–7800)
PLATELETS: 247 10*3/uL (ref 140–400)
RBC: 4.93 MIL/uL (ref 3.80–5.10)
RDW: 15.5 % — AB (ref 11.0–15.0)
WBC: 5.8 10*3/uL (ref 3.8–10.8)

## 2015-07-29 LAB — LIPID PANEL
Cholesterol: 180 mg/dL (ref 125–200)
HDL: 43 mg/dL — ABNORMAL LOW (ref >=46)
LDL Cholesterol: 122 mg/dL (ref <130)
Total CHOL/HDL Ratio: 4.2 Ratio (ref <=5.0)
Triglycerides: 75 mg/dL (ref <150)
VLDL: 15 mg/dL (ref <30)

## 2015-07-30 LAB — T3, FREE: T3, Free: 2.5 pg/mL (ref 2.3–4.2)

## 2015-07-30 LAB — T4, FREE: FREE T4: 1.2 ng/dL (ref 0.8–1.8)

## 2015-07-30 LAB — TSH: TSH: 1.54 mIU/L

## 2015-07-31 ENCOUNTER — Encounter: Payer: Self-pay | Admitting: Family Medicine

## 2015-08-05 ENCOUNTER — Ambulatory Visit (INDEPENDENT_AMBULATORY_CARE_PROVIDER_SITE_OTHER): Payer: BLUE CROSS/BLUE SHIELD | Admitting: Family Medicine

## 2015-08-05 ENCOUNTER — Encounter: Payer: Self-pay | Admitting: Family Medicine

## 2015-08-05 VITALS — BP 118/66 | HR 74 | Temp 98.4°F | Resp 12 | Ht 70.0 in | Wt 262.0 lb

## 2015-08-05 DIAGNOSIS — M545 Low back pain, unspecified: Secondary | ICD-10-CM

## 2015-08-05 DIAGNOSIS — R7303 Prediabetes: Secondary | ICD-10-CM | POA: Diagnosis not present

## 2015-08-05 DIAGNOSIS — E038 Other specified hypothyroidism: Secondary | ICD-10-CM

## 2015-08-05 DIAGNOSIS — Z0000 Encounter for general adult medical examination without abnormal findings: Secondary | ICD-10-CM | POA: Diagnosis not present

## 2015-08-05 DIAGNOSIS — F53 Postpartum depression: Secondary | ICD-10-CM

## 2015-08-05 DIAGNOSIS — F329 Major depressive disorder, single episode, unspecified: Secondary | ICD-10-CM | POA: Insufficient documentation

## 2015-08-05 DIAGNOSIS — F320 Major depressive disorder, single episode, mild: Secondary | ICD-10-CM | POA: Insufficient documentation

## 2015-08-05 DIAGNOSIS — O99345 Other mental disorders complicating the puerperium: Secondary | ICD-10-CM

## 2015-08-05 LAB — HEMOGLOBIN A1C
Hgb A1c MFr Bld: 5.8 % — ABNORMAL HIGH (ref <5.7)
MEAN PLASMA GLUCOSE: 120 mg/dL

## 2015-08-05 MED ORDER — LEVOTHYROXINE SODIUM 125 MCG PO TABS: 125.0000 ug | ORAL_TABLET | Freq: Every day | ORAL | Status: DC

## 2015-08-05 MED ORDER — SERTRALINE HCL 50 MG PO TABS: 50.0000 mg | ORAL_TABLET | Freq: Every day | ORAL | Status: DC

## 2015-08-05 NOTE — Progress Notes (Signed)
Patient ID: Charlotte Carter, female   DOB: 06/30/1975, 40 y.o.   MRN: [id number]   Subjective:    Patient ID: Charlotte Carter, female    DOB: 1975-05-01, 40 y.o.   MRN: [id number]  Patient presents for CPE History here for complete physical exam. She is now 4 months postpartum. She's had some time adjusting to the new baby. She does have some fatigue from not sleeping very well. She is taking her medications as prescribed. Her GYN checked her thyroid function studies back about 2 months ago. She states that she has done well with the current dose.  Her only complaint is some low back pain that she's had since delivery. She did have some complications at the end of delivery she had gestational diabetes she has be induced. She underwent a C-section she had some mild elevation of her blood pressure but she was not diagnosis preeclampsia. Since then she has had the back pain mostly on the right side initially had some shooting pain and some numbness of her lateral right thigh that is now resolved. She's been trying to stretch and be more active but that has decreased and she is went back to work 4 weeks ago.  She is still taking Zoloft for her mood she does admit to some postpartum depression and feels like the medication has helped.  She initially lost weight after delivery but now has gained back about 20 pounds. She is pumping and breast-feeding but noticed that her milk supply is also drying up.  Review Of Systems:  GEN- denies fatigue, fever, weight loss,weakness, recent illness HEENT- denies eye drainage, change in vision, nasal discharge, CVS- denies chest pain, palpitations RESP- denies SOB, cough, wheeze ABD- denies N/V, change in stools, abd pain GU- denies dysuria, hematuria, dribbling, incontinence MSK-+joint pain, muscle aches, injury Neuro- denies headache, dizziness, syncope, seizure activity       Objective:    BP 118/66 mmHg  Pulse 74  Temp(Src) 98.4 F (36.9 C) (Oral)   Resp 12  Ht 5\' 10"  (1.778 m)  Wt 262 lb (118.842 kg)  BMI 37.59 kg/m2  Breastfeeding? Yes GEN- NAD, alert and oriented x3 HEENT- PERRL, EOMI, non injected sclera, pink conjunctiva, MMM, oropharynx clear Neck- Supple, no thyromegaly CVS- RRR, no murmur RESP-CTAB ABD-NABS,soft,NT,ND Psych- normal affect and mood MSK- Spine NT, TTP right paraspinals, fair ROM, Neg SLR, Good ROM Hips  EXT- No edema Pulses- Radial, DP- 2+        Assessment & Plan:      Problem List Items Addressed This Visit    Prediabetes    Her A1c is below 6 however with her obesity and gestational diabetes I will continue her on the metformin for another few months. Hopefully she can work to get down on her weight and will be to stop this medication. She is at high risk for developing diabetes.      Relevant Orders   Hemoglobin A1C, fingerstick   Post partum depression    Continue Zoloft at current dose. This is a big adjustment. Her now she is back at work which is now a second adjustment. I do not think this a good time to stop the medication. We'll reassess at our next visit.      Relevant Medications   sertraline (ZOLOFT) 50 MG tablet   Hypothyroidism    Thyroid function I Gollnick change the dose.      Relevant Medications   levothyroxine (SYNTHROID, LEVOTHROID) 125 MCG tablet    Other  Visit Diagnoses    Routine general medical examination at a health care facility    -  Primary    CPE done,fasting labs reviewed, continue zoloft at current dose, immunizations UTD    Right-sided low back pain without sciatica        Likely result of the pregnancy and birth. Some of her symptoms are resolving. We will obtain an x-ray I will hold on MRI at this time. Okay to take Aleve or tyl    Relevant Orders    DG Lumbar Spine Complete       Note: This dictation was prepared with Dragon dictation along with smaller phrase technology. Any transcriptional errors that result from this process are unintentional.

## 2015-08-05 NOTE — Assessment & Plan Note (Signed)
Continue Zoloft at current dose. This is a big adjustment. Her now she is back at work which is now a second adjustment. I do not think this a good time to stop the medication. We'll reassess at our next visit.

## 2015-08-05 NOTE — Assessment & Plan Note (Signed)
Her A1c is below 6 however with her obesity and gestational diabetes I will continue her on the metformin for another few months. Hopefully she can work to get down on her weight and will be to stop this medication. She is at high risk for developing diabetes.

## 2015-08-05 NOTE — Assessment & Plan Note (Signed)
Thyroid function I Gollnick change the dose.

## 2015-08-05 NOTE — Patient Instructions (Addendum)
Labs look good Aleve no more than one a day , try stretching   Acetaminophen is okay to take  F/U 4 months

## 2015-08-14 ENCOUNTER — Encounter: Payer: Self-pay | Admitting: Family Medicine

## 2015-09-03 ENCOUNTER — Encounter: Payer: Self-pay | Admitting: Family Medicine

## 2016-01-13 ENCOUNTER — Encounter: Payer: Self-pay | Admitting: Family Medicine

## 2016-01-13 ENCOUNTER — Ambulatory Visit (INDEPENDENT_AMBULATORY_CARE_PROVIDER_SITE_OTHER): Payer: BLUE CROSS/BLUE SHIELD | Admitting: Family Medicine

## 2016-01-13 VITALS — BP 134/78 | HR 78 | Temp 99.0°F | Resp 20 | Ht 70.0 in | Wt 285.0 lb

## 2016-01-13 DIAGNOSIS — J029 Acute pharyngitis, unspecified: Secondary | ICD-10-CM

## 2016-01-13 DIAGNOSIS — J069 Acute upper respiratory infection, unspecified: Secondary | ICD-10-CM | POA: Diagnosis not present

## 2016-01-13 LAB — STREP GROUP A AG, W/REFLEX TO CULT: STREGTOCOCCUS GROUP A AG SCREEN: NOT DETECTED

## 2016-01-13 MED ORDER — AMOXICILLIN-POT CLAVULANATE 875-125 MG PO TABS: 1.0000 | ORAL_TABLET | Freq: Two times a day (BID) | ORAL | 0 refills | Status: DC

## 2016-01-13 MED ORDER — FLUCONAZOLE 150 MG PO TABS
150.0000 mg | ORAL_TABLET | Freq: Once | ORAL | 1 refills | Status: AC
Start: 2016-01-13 — End: 2016-01-13

## 2016-01-13 NOTE — Progress Notes (Signed)
   Subjective:    Patient ID: Charlotte Carter, female    DOB: Mar 25, 1976, 40 y.o.   MRN: [id number]  Patient presents for Illness (x2 weeks- nonproductive cough, sore throat, nasal congestion)  Sore throat and nasal congestion postnasal drainage worsening over the past 2-1/2 weeks. Initially started with some cough and throat pain. She has to had some old amoxicillin which had expired she did take 5 days of this and then she went to [place] as she was on vacation. She do does admit that she had late nights and was drinking alcohol and did not improve while she was there before she started taking Mucinex DM and Sudafed to help with her sinus congestion. She had low-grade fevers well while she was in [place]. Since she has returned her symptoms have not improved. Her husband is also sick   Review Of Systems:  GEN- denies fatigue, fever, weight loss,weakness, recent illness HEENT- denies eye drainage, change in vision, +nasal discharge, CVS- denies chest pain, palpitations RESP- denies SOB, +cough, wheeze ABD- denies N/V, change in stools, abd pain GU- denies dysuria, hematuria, dribbling, incontinence MSK- denies joint pain, muscle aches, injury Neuro- denies headache, dizziness, syncope, seizure activity       Objective:    BP 134/78 (BP Location: Left Arm, Patient Position: Sitting, Cuff Size: Large)   Pulse 78   Temp 99 F (37.2 C) (Oral)   Resp 20   Ht 5\' 10"  (1.778 m)   Wt 285 lb (129.3 kg)   SpO2 98% Comment: RA  BMI 40.89 kg/m  GEN- NAD, alert and oriented x3 HEENT- PERRL, EOMI, non injected sclera, pink conjunctiva, MMM, oropharynx mild injection, TM clear bilat no effusion, no maxillary sinus tenderness,+ Nasal drainage  Neck- Supple,ant  LAD CVS- RRR, no murmur RESP-CTAB EXT- No edema Pulses- Radial 2+          Assessment & Plan:      Problem List Items Addressed This Visit    None    Visit Diagnoses    Sore throat    -  Primary   Relevant Orders   STREP  GROUP A AG, W/REFLEX TO CULT (Completed)   Acute URI       URI worsening, some sinusitis symptoms contributing to sore throat. Strep Neg, Augmentin, saline rinses, gargle, lozenges   Relevant Medications   fluconazole (DIFLUCAN) 150 MG tablet      Note: This dictation was prepared with Dragon dictation along with smaller phrase technology. Any transcriptional errors that result from this process are unintentional.

## 2016-01-13 NOTE — Patient Instructions (Signed)
Take antibiotics as prescribed F/U as previous  

## 2016-01-15 LAB — CULTURE, GROUP A STREP: Organism ID, Bacteria: NORMAL

## 2016-01-21 ENCOUNTER — Encounter: Payer: Self-pay | Admitting: Family Medicine

## 2016-01-22 ENCOUNTER — Encounter: Payer: Self-pay | Admitting: Family Medicine

## 2016-01-22 MED ORDER — LEVOFLOXACIN 500 MG PO TABS: 500.0000 mg | ORAL_TABLET | Freq: Every day | ORAL | 0 refills | Status: DC

## 2016-03-24 ENCOUNTER — Encounter: Payer: Self-pay | Admitting: Physician Assistant

## 2016-03-24 ENCOUNTER — Ambulatory Visit (INDEPENDENT_AMBULATORY_CARE_PROVIDER_SITE_OTHER): Payer: BLUE CROSS/BLUE SHIELD | Admitting: Physician Assistant

## 2016-03-24 VITALS — BP 130/78 | HR 74 | Temp 98.7°F | Resp 14 | Wt 284.0 lb

## 2016-03-24 DIAGNOSIS — L739 Follicular disorder, unspecified: Secondary | ICD-10-CM

## 2016-03-24 DIAGNOSIS — B084 Enteroviral vesicular stomatitis with exanthem: Secondary | ICD-10-CM

## 2016-03-24 MED ORDER — MAGIC MOUTHWASH W/LIDOCAINE: 5.0000 mL | Freq: Four times a day (QID) | ORAL | 0 refills | Status: DC | PRN

## 2016-03-24 MED ORDER — DOXYCYCLINE HYCLATE 100 MG PO TABS: 100.0000 mg | ORAL_TABLET | Freq: Two times a day (BID) | ORAL | 0 refills | Status: DC

## 2016-03-24 NOTE — Progress Notes (Signed)
Patient ID: Charlotte Carter MRN: [id number], DOB: May 02, 1975, 40 y.o. Date of Encounter: @DATE @  Chief Complaint:  Chief Complaint  Patient presents with  . Rash    on hands, feet, neck,elbow  . Fever  . Chills  . Sore Throat    HPI: 40 y.o. year old female  presents with above.   She states that her daughter had similar illness last week. States that last Wednesday her daughter had a fever. That Thursday she continued with fever. Friday she started to develop rash and patient took her to the doctor that day and that's when she was diagnosed with hand-foot mouth.  Patient started feeling sick Sunday night. On Monday she felt achy all over and "just felt horrible ". On Tuesday she went to work because she thought she just had a virus and thought she was feeling some better. On Wednesday she developed rash on her hands and was sent home from work. This was yesterday. She now has rash on the palms of her hands the plantar surface of her feet and feels a little bit of sore irritation at the back of her throat.  She also had one other issue to have me look at. Shows me a "bump" in her left axilla----says that that has recently come up over the last week or so.  No other complaints or concerns.   Past Medical History:  Diagnosis Date  . GDM (gestational diabetes mellitus)   . Hx of varicella   . Hyperlipidemia   . Hypothyroidism   . Rosacea   . Thyroid disease   . Vaginal Pap smear, abnormal      Home Meds: Outpatient Medications Prior to Visit  Medication Sig Dispense Refill  . b complex vitamins tablet Take 1 tablet by mouth daily.    Marland Kitchen levothyroxine (SYNTHROID, LEVOTHROID) 125 MCG tablet Take 1 tablet (125 mcg total) by mouth daily before breakfast. 90 tablet 3  . metFORMIN (GLUCOPHAGE) 500 MG tablet Take 1 tablet (500 mg total) by mouth at bedtime. 90 tablet 4  . Omega-3 Fatty Acids (FISH OIL) 1200 MG CAPS Take 1 capsule by mouth 2 (two) times daily.     . sertraline  (ZOLOFT) 50 MG tablet Take 1 tablet (50 mg total) by mouth daily. 90 tablet 3  . vitamin C (ASCORBIC ACID) 500 MG tablet Take 500 mg by mouth daily.    . Zinc Sulfate (ZINC 15 PO) Take by mouth.    Marland Kitchen amoxicillin-clavulanate (AUGMENTIN) 875-125 MG tablet Take 1 tablet by mouth 2 (two) times daily. (Patient not taking: Reported on 03/24/2016) 20 tablet 0  . levofloxacin (LEVAQUIN) 500 MG tablet Take 1 tablet (500 mg total) by mouth daily. (Patient not taking: Reported on 03/24/2016) 5 tablet 0  . Prenatal Multivit-Min-Fe-FA (PRENATAL VITAMINS PO) Take 1 tablet by mouth daily.      No facility-administered medications prior to visit.     Allergies: No Known Allergies  Social History   Social History  . Marital status: Married    Spouse name: N/A  . Number of children: N/A  . Years of education: N/A   Occupational History  . Not on file.   Social History Main Topics  . Smoking status: Former Research scientist (life sciences)  . Smokeless tobacco: Never Used  . Alcohol use 10.5 oz/week    21 Standard drinks or equivalent per week  . Drug use: No  . Sexual activity: Yes   Other Topics Concern  . Not on file  Social History Narrative  . No narrative on file    Family History  Problem Relation Age of Onset  . Diabetes Mother   . Hypertension Mother   . Hyperlipidemia Mother   . Arthritis Mother   . Alcohol abuse Mother   . Hypothyroidism Mother   . Hypertension Father   . Hyperlipidemia Father   . Arthritis Father   . Alcohol abuse Father   . Heart disease Father   . Hypothyroidism Father   . Asthma Sister   . Hypertension Sister   . Diabetes Maternal Grandmother      Review of Systems:  See HPI for pertinent ROS. All other ROS negative.    Physical Exam: Blood pressure 130/78, pulse 74, temperature 98.7 F (37.1 C), temperature source Oral, resp. rate 14, weight 284 lb (128.8 kg), SpO2 98 %, currently breastfeeding., Body mass index is 40.75 kg/m. General: Obese WF. Appears in no acute  distress. Head: Normocephalic, atraumatic, eyes without discharge, sclera non-icteric, nares are without discharge. Bilateral auditory canals clear, TM's are without perforation, pearly grey and translucent with reflective cone of light bilaterally. Oral cavity moist. Uvula with moderate erythema. Extending from the uvula toward the roof of the mouth is splotchy erythema. No other erythema or lesions in the mouth.  Neck: Supple. No thyromegaly. No lymphadenopathy. Lungs: Clear bilaterally to auscultation without wheezes, rales, or rhonchi. Breathing is unlabored. Heart: RRR with S1 S2. No murmurs, rubs, or gallops. Musculoskeletal:  Strength and tone normal for age. Skin: hands covered with reddish/purplish macules, each about the diameter of pencil eraser. Plantar surface of feet covered with same findings.  No other area of rash.  Left axilla--There is 1 hair follicle that has some mild irritation. Lateral to this there is another hair follicle that has erythematous papule.  Neuro: Alert and oriented X 3. Moves all extremities spontaneously. Gait is normal. CNII-XII grossly in tact. Psych:  Responds to questions appropriately with a normal affect.     ASSESSMENT AND PLAN:  40 y.o. year old female with  1. Hand, foot and mouth disease Discussed with her that this is a virus which should run its course and resolve on its own. She needs to stay out of work and stay home and avoid contact with others. Note given to cover her out of work today and tomorrow and then she is off the weekend. If rash not resolving Monday then follow-up with [place] Monday for further work note. She can use the Magic mouthwash to help with pain in the mouth/throat. - magic mouthwash w/lidocaine SOLN; Take 5 mLs by mouth 4 (four) times daily as needed for mouth pain.  Dispense: 100 mL; Refill: 0  2. Folliculitis Take doxycycline as directed. Follow-up if site does not resolve upon completion of antibiotic. - doxycycline  (VIBRA-TABS) 100 MG tablet; Take 1 tablet (100 mg total) by mouth 2 (two) times daily.  Dispense: 14 tablet; Refill: 0   Signed, [address] [place], [place], [facility] 03/24/2016 4:38 PM

## 2016-03-25 ENCOUNTER — Encounter: Payer: Self-pay | Admitting: Physician Assistant

## 2016-06-08 ENCOUNTER — Encounter: Payer: Self-pay | Admitting: Family Medicine

## 2016-06-13 ENCOUNTER — Other Ambulatory Visit: Payer: Self-pay | Admitting: Family Medicine

## 2016-07-06 ENCOUNTER — Other Ambulatory Visit: Payer: Self-pay | Admitting: Family Medicine

## 2016-07-07 NOTE — Telephone Encounter (Signed)
Rx refilled one time and pt sent letter to schedule yearly CPE next month

## 2016-08-03 ENCOUNTER — Encounter: Payer: Self-pay | Admitting: Family Medicine

## 2016-08-03 DIAGNOSIS — Z0000 Encounter for general adult medical examination without abnormal findings: Secondary | ICD-10-CM

## 2016-08-03 DIAGNOSIS — R7309 Other abnormal glucose: Secondary | ICD-10-CM

## 2016-08-09 ENCOUNTER — Encounter: Payer: Self-pay | Admitting: Family Medicine

## 2016-08-10 ENCOUNTER — Encounter: Payer: Self-pay | Admitting: Family Medicine

## 2016-08-25 ENCOUNTER — Encounter: Payer: Self-pay | Admitting: Orthopaedic Surgery

## 2016-08-25 ENCOUNTER — Ambulatory Visit (INDEPENDENT_AMBULATORY_CARE_PROVIDER_SITE_OTHER): Payer: BLUE CROSS/BLUE SHIELD | Admitting: Orthopaedic Surgery

## 2016-08-25 VITALS — BP 116/71 | HR 87 | Temp 98.6°F

## 2016-08-25 DIAGNOSIS — S92354A Nondisplaced fracture of fifth metatarsal bone, right foot, initial encounter for closed fracture: Secondary | ICD-10-CM | POA: Diagnosis not present

## 2016-08-25 DIAGNOSIS — S92525A Nondisplaced fracture of medial phalanx of left lesser toe(s), initial encounter for closed fracture: Secondary | ICD-10-CM

## 2016-08-25 DIAGNOSIS — T07XXXA Unspecified multiple injuries, initial encounter: Secondary | ICD-10-CM | POA: Diagnosis not present

## 2016-08-25 NOTE — Progress Notes (Signed)
Subjective:    Patient ID: Charlotte Carter, female    DOB: 11-14-1975, 41 y.o.   MRN: [id number]  HPI She fell yesterday on a storm drain after stepping off a curb wrong.  She hit her left lower leg and knee and left foot and then the right foot.  She has large abrasions of the left lateral lower leg and knee area and medial distal thigh.  She hurt her little toe and the right lateral foot.  She was seen at [facility] on [address] in [place] after the injury.  She was evaluated.  X-rays showed fracture of the base of the right fifth metatarsal and of the left little toe middle phalanx medially.  Both are nondisplaced.  She was treated for the large abrasions.  She was given CAM walker on the right and the little toe was buddy taped.  She had no other injury. She was given pain medicine.  She called and asked to be seen today. 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   Review of Systems  HENT: Negative for congestion.   Respiratory: Negative for cough and shortness of breath.   Cardiovascular: Negative for chest pain and leg swelling.  Endocrine: Negative for cold intolerance.  Musculoskeletal: Positive for arthralgias, gait problem and joint swelling.  Allergic/Immunologic: Negative for environmental allergies.   Past Medical History:  Diagnosis Date  . GDM (gestational diabetes mellitus)   . Hx of varicella   . Hyperlipidemia   . Hypothyroidism   . Rosacea   . Thyroid disease   . Vaginal Pap smear, abnormal     Past Surgical History:  Procedure Laterality Date  . CESAREAN SECTION N/A 04/06/2015   Procedure: CESAREAN SECTION;  Surgeon: Aloha Gell, MD;  Location: [facility];  Service: Obstetrics;  Laterality: N/A;  . WISDOM TOOTH EXTRACTION      Current Outpatient Prescriptions on File Prior to Visit  Medication Sig Dispense Refill  . amoxicillin-clavulanate (AUGMENTIN) 875-125 MG tablet Take 1 tablet by mouth 2  (two) times daily. (Patient not taking: Reported on 03/24/2016) 20 tablet 0  . b complex vitamins tablet Take 1 tablet by mouth daily.    Marland Kitchen doxycycline (VIBRA-TABS) 100 MG tablet Take 1 tablet (100 mg total) by mouth 2 (two) times daily. 14 tablet 0  . levofloxacin (LEVAQUIN) 500 MG tablet Take 1 tablet (500 mg total) by mouth daily. (Patient not taking: Reported on 03/24/2016) 5 tablet 0  . levothyroxine (SYNTHROID, LEVOTHROID) 125 MCG tablet TAKE 1 TABLET BY MOUTH  DAILY BEFORE BREAKFAST 90 tablet 0  . magic mouthwash w/lidocaine SOLN Take 5 mLs by mouth 4 (four) times daily as needed for mouth pain. 100 mL 0  . metFORMIN (GLUCOPHAGE) 500 MG tablet TAKE 1 TABLET BY MOUTH AT  BEDTIME 90 tablet 1  . Omega-3 Fatty Acids (FISH OIL) 1200 MG CAPS Take 1 capsule by mouth 2 (two) times daily.     . Prenatal Multivit-Min-Fe-FA (PRENATAL VITAMINS PO) Take 1 tablet by mouth daily.     . sertraline (ZOLOFT) 50 MG tablet TAKE 1 TABLET BY MOUTH  DAILY 90 tablet 1  . vitamin C (ASCORBIC ACID) 500 MG tablet Take 500 mg by mouth daily.    . Zinc Sulfate (ZINC 15 PO) Take by mouth.     No current facility-administered medications on file prior to visit.     Social History   Social History  . Marital status: Married  Spouse name: N/A  . Number of children: N/A  . Years of education: N/A   Occupational History  . Not on file.   Social History Main Topics  . Smoking status: Former Research scientist (life sciences)  . Smokeless tobacco: Never Used  . Alcohol use 10.5 oz/week    21 Standard drinks or equivalent per week  . Drug use: No  . Sexual activity: Yes   Other Topics Concern  . Not on file   Social History Narrative  . No narrative on file    Family History  Problem Relation Age of Onset  . Diabetes Mother   . Hypertension Mother   . Hyperlipidemia Mother   . Arthritis Mother   . Alcohol abuse Mother   . Hypothyroidism Mother   . Hypertension Father   . Hyperlipidemia Father   . Arthritis Father   .  Alcohol abuse Father   . Heart disease Father   . Hypothyroidism Father   . Asthma Sister   . Hypertension Sister   . Diabetes Maternal Grandmother     BP 116/71   Pulse 87   Temp 98.6 F (37 C)      Objective:   Physical Exam  Constitutional: She is oriented to person, place, and time. She appears well-developed and well-nourished.  HENT:  Head: Normocephalic and atraumatic.  Eyes: Conjunctivae and EOM are normal. Pupils are equal, round, and reactive to light.  Neck: Normal range of motion. Neck supple.  Cardiovascular: Normal rate, regular rhythm and intact distal pulses.   Pulmonary/Chest: Effort normal.  Abdominal: Soft.  Musculoskeletal: She exhibits tenderness (Right lateral foot tender, no redness or bruising; left little toe with medial abrasions, swelling.  Very large abrasion lateral left lower leg, knee and medial distal thigh.  NV intact.).  Neurological: She is alert and oriented to person, place, and time. She displays normal reflexes. No cranial nerve deficit. She exhibits normal muscle tone. Coordination normal.  Skin: Skin is warm and dry.  Psychiatric: She has a normal mood and affect. Her behavior is normal. Judgment and thought content normal.  Vitals reviewed.    I reviewed the x-rays from the Urgent care.     Assessment & Plan:   Encounter Diagnoses  Name Primary?  . Closed nondisplaced fracture of fifth metatarsal bone of right foot, initial encounter Yes  . Closed nondisplaced fracture of middle phalanx of lesser toe of left foot, initial encounter   . Abrasions of multiple sites    She is to continue the CAM walker, elevate, ice.  Buddy tape the little toe to the fourth toe with gauze between them, elevate, ice.  OpSite applied to the large abrasions.  Care explained.  Use walker or scooter.  Contrast bath sheet given.  Call if any problem.  Return on Tuesday for wound check.  X-ray of the right foot.  Precautions  discussed.  Continue present pain medicine.  Tylenol, Advil or Aleve as needed.  Out of work.  Electronically Signed Sanjuana Kava, MD 5/10/201811:33 AM

## 2016-08-25 NOTE — Patient Instructions (Signed)
Out of work 

## 2016-08-29 ENCOUNTER — Other Ambulatory Visit: Payer: Self-pay | Admitting: Radiology

## 2016-08-29 DIAGNOSIS — S92354D Nondisplaced fracture of fifth metatarsal bone, right foot, subsequent encounter for fracture with routine healing: Secondary | ICD-10-CM

## 2016-08-30 ENCOUNTER — Ambulatory Visit (INDEPENDENT_AMBULATORY_CARE_PROVIDER_SITE_OTHER): Payer: BLUE CROSS/BLUE SHIELD

## 2016-08-30 ENCOUNTER — Encounter: Payer: Self-pay | Admitting: Orthopaedic Surgery

## 2016-08-30 ENCOUNTER — Ambulatory Visit (INDEPENDENT_AMBULATORY_CARE_PROVIDER_SITE_OTHER): Payer: Self-pay | Admitting: Orthopaedic Surgery

## 2016-08-30 DIAGNOSIS — S92354D Nondisplaced fracture of fifth metatarsal bone, right foot, subsequent encounter for fracture with routine healing: Secondary | ICD-10-CM | POA: Diagnosis not present

## 2016-08-30 DIAGNOSIS — S92525A Nondisplaced fracture of medial phalanx of left lesser toe(s), initial encounter for closed fracture: Secondary | ICD-10-CM

## 2016-08-30 DIAGNOSIS — T07XXXA Unspecified multiple injuries, initial encounter: Secondary | ICD-10-CM

## 2016-08-30 MED ORDER — HYDROCODONE-ACETAMINOPHEN 5-325 MG PO TABS: ORAL_TABLET | ORAL | 0 refills | Status: DC

## 2016-08-30 NOTE — Patient Instructions (Signed)
Out of work 

## 2016-08-30 NOTE — Progress Notes (Signed)
CC: my feet are better.  She has less pain of both feet.  She has been using the CAM walker on the right and buddy taping the little toe on the left.  Her abrasions are better and she has been using the Opsite.  NV intact.  She has healing abrasions on the left lower leg laterally, over patella and medial left thigh which look good.  There is no redness or drainage.  X-rays were done of the right foot, reported separately.  Encounter Diagnoses  Name Primary?  . Closed nondisplaced fracture of fifth metatarsal bone of right foot with routine healing, subsequent encounter Yes  . Closed nondisplaced fracture of middle phalanx of lesser toe of left foot, initial encounter   . Abrasions of multiple sites    Remain out of work.  I have reviewed the [place] web site prior to prescribing narcotic medicine for this patient.  Return in two weeks.  X-rays on return.  Electronically Signed Sanjuana Kava, MD 5/15/201810:47 AM

## 2016-09-14 ENCOUNTER — Encounter: Payer: Self-pay | Admitting: Orthopaedic Surgery

## 2016-09-14 ENCOUNTER — Ambulatory Visit (INDEPENDENT_AMBULATORY_CARE_PROVIDER_SITE_OTHER): Payer: BLUE CROSS/BLUE SHIELD

## 2016-09-14 ENCOUNTER — Ambulatory Visit (INDEPENDENT_AMBULATORY_CARE_PROVIDER_SITE_OTHER): Payer: BLUE CROSS/BLUE SHIELD | Admitting: Orthopaedic Surgery

## 2016-09-14 DIAGNOSIS — S92354D Nondisplaced fracture of fifth metatarsal bone, right foot, subsequent encounter for fracture with routine healing: Secondary | ICD-10-CM

## 2016-09-14 DIAGNOSIS — S92525A Nondisplaced fracture of medial phalanx of left lesser toe(s), initial encounter for closed fracture: Secondary | ICD-10-CM

## 2016-09-14 NOTE — Progress Notes (Signed)
Charlotte Carter, female DOB:1975-12-04, 41 y.o. UMP:[id number]  Chief Complaint  Charlotte presents with  . Follow-up    RT FOOT, LT LITTLE TOE FX    HPI  Charlotte Carter is a 41 y.o. female who has fracture of the little toe on the left and fifth metatarsal on the right.  She caught her little toe on something this past week when she did not have the buddy tape on and rehurt her toe.  She has less pain.  She has been using the CAM walker.    I will have her remain out of work another two weeks.  I have taken x-rays of the little toe and explained that there is a change in position and alignment of the fracture.  Surgery could be done to correct the intra-articular fracture.  She has much less pain now of the toe and she says she does not want to consider any surgery.  HPI  There is no height or weight on file to calculate BMI.  ROS  Review of Systems  HENT: Negative for congestion.   Respiratory: Negative for cough and shortness of breath.   Cardiovascular: Negative for chest pain and leg swelling.  Endocrine: Negative for cold intolerance.  Musculoskeletal: Positive for arthralgias, gait problem and joint swelling.  Allergic/Immunologic: Negative for environmental allergies.    Past Medical History:  Diagnosis Date  . GDM (gestational diabetes mellitus)   . Hx of varicella   . Hyperlipidemia   . Hypothyroidism   . Rosacea   . Thyroid disease   . Vaginal Pap smear, abnormal     Past Surgical History:  Procedure Laterality Date  . CESAREAN SECTION N/A 04/06/2015   Procedure: CESAREAN SECTION;  Surgeon: Aloha Gell, MD;  Location: [facility];  Service: Obstetrics;  Laterality: N/A;  . WISDOM TOOTH EXTRACTION      Family History  Problem Relation Age of Onset  . Diabetes Mother   . Hypertension Mother   . Hyperlipidemia Mother   . Arthritis Mother   . Alcohol abuse Mother   . Hypothyroidism Mother   . Hypertension Father   . Hyperlipidemia Father   .  Arthritis Father   . Alcohol abuse Father   . Heart disease Father   . Hypothyroidism Father   . Asthma Sister   . Hypertension Sister   . Diabetes Maternal Grandmother     Social History Social History  Substance Use Topics  . Smoking status: Former Research scientist (life sciences)  . Smokeless tobacco: Never Used  . Alcohol use 10.5 oz/week    21 Standard drinks or equivalent per week    No Known Allergies  Current Outpatient Prescriptions  Medication Sig Dispense Refill  . amoxicillin-clavulanate (AUGMENTIN) 875-125 MG tablet Take 1 tablet by mouth 2 (two) times daily. (Charlotte not taking: Reported on 03/24/2016) 20 tablet 0  . b complex vitamins tablet Take 1 tablet by mouth daily.    Marland Kitchen doxycycline (VIBRA-TABS) 100 MG tablet Take 1 tablet (100 mg total) by mouth 2 (two) times daily. 14 tablet 0  . HYDROcodone-acetaminophen (NORCO/VICODIN) 5-325 MG tablet One tablet by mouth every six hours as needed for pain.  Seven day limit 28 tablet 0  . levofloxacin (LEVAQUIN) 500 MG tablet Take 1 tablet (500 mg total) by mouth daily. (Charlotte not taking: Reported on 03/24/2016) 5 tablet 0  . levothyroxine (SYNTHROID, LEVOTHROID) 125 MCG tablet TAKE 1 TABLET BY MOUTH  DAILY BEFORE BREAKFAST 90 tablet 0  . magic mouthwash w/lidocaine SOLN Take 5  mLs by mouth 4 (four) times daily as needed for mouth pain. 100 mL 0  . metFORMIN (GLUCOPHAGE) 500 MG tablet TAKE 1 TABLET BY MOUTH AT  BEDTIME 90 tablet 1  . Omega-3 Fatty Acids (FISH OIL) 1200 MG CAPS Take 1 capsule by mouth 2 (two) times daily.     . Prenatal Multivit-Min-Fe-FA (PRENATAL VITAMINS PO) Take 1 tablet by mouth daily.     . sertraline (ZOLOFT) 50 MG tablet TAKE 1 TABLET BY MOUTH  DAILY 90 tablet 1  . vitamin C (ASCORBIC ACID) 500 MG tablet Take 500 mg by mouth daily.    . Zinc Sulfate (ZINC 15 PO) Take by mouth.     No current facility-administered medications for this visit.      Physical Exam  unknown if currently breastfeeding.  Constitutional:  overall normal hygiene, normal nutrition, well developed, normal grooming, normal body habitus. Assistive device:CAM walker right, post op shoe left  Musculoskeletal: gait and station Limp right (she has a knee scooter), muscle tone and strength are normal, no tremors or atrophy is present.  .  Neurological: coordination overall normal.  Deep tendon reflex/nerve stretch intact.  Sensation normal.  Cranial nerves II-XII intact.   Skin:   Normal overall no scars, lesions, ulcers or rashes. No psoriasis.  Psychiatric: Alert and oriented x 3.  Recent memory intact, remote memory unclear.  Normal mood and affect. Well groomed.  Good eye contact.  Cardiovascular: overall no swelling, no varicosities, no edema bilaterally, normal temperatures of the legs and arms, no clubbing, cyanosis and good capillary refill.  Lymphatic: palpation is normal.  Right foot with some tenderness of the base of the fifth metatarsal.  No swelling.  NV intact.  Left little toe with slight swelling, no deformity or rotation.  NV intact.  No redness.  The Charlotte has been educated about the nature of the problem(s) and counseled on treatment options.  The Charlotte appeared to understand what I have discussed and is in agreement with it.  Encounter Diagnoses  Name Primary?  . Closed nondisplaced fracture of fifth metatarsal bone of right foot with routine healing, subsequent encounter Yes  . Closed nondisplaced fracture of middle phalanx of lesser toe of left foot, initial encounter    X-rays were done of the left little toe and the right foot, reported separately.  PLAN Call if any problems.  Precautions discussed.  Continue current medications.   Return to clinic 2 weeks   Weight bear as tolerated.  Stay out of work.  Electronically Signed Sanjuana Kava, MD 5/30/201810:50 AM

## 2016-09-14 NOTE — Patient Instructions (Signed)
Out of work 

## 2016-09-19 ENCOUNTER — Encounter: Payer: Self-pay | Admitting: Family Medicine

## 2016-09-19 DIAGNOSIS — Z0000 Encounter for general adult medical examination without abnormal findings: Secondary | ICD-10-CM

## 2016-09-19 DIAGNOSIS — R7989 Other specified abnormal findings of blood chemistry: Secondary | ICD-10-CM

## 2016-09-19 DIAGNOSIS — Z13220 Encounter for screening for lipoid disorders: Secondary | ICD-10-CM

## 2016-09-19 DIAGNOSIS — R7309 Other abnormal glucose: Secondary | ICD-10-CM

## 2016-09-20 ENCOUNTER — Encounter: Payer: Self-pay | Admitting: Orthopaedic Surgery

## 2016-09-21 ENCOUNTER — Other Ambulatory Visit: Payer: Self-pay | Admitting: Family Medicine

## 2016-09-21 LAB — LIPID PANEL
CHOL/HDL RATIO: 6.8 ratio — AB (ref <5.0)
Cholesterol: 197 mg/dL (ref <200)
HDL: 29 mg/dL — AB (ref >50)
LDL Cholesterol: 121 mg/dL — ABNORMAL HIGH (ref <100)
TRIGLYCERIDES: 233 mg/dL — AB (ref <150)
VLDL: 47 mg/dL — ABNORMAL HIGH (ref <30)

## 2016-09-21 LAB — COMPLETE METABOLIC PANEL WITH GFR
ALT: 22 U/L (ref 6–29)
AST: 18 U/L (ref 10–30)
Albumin: 4.2 g/dL (ref 3.6–5.1)
Alkaline Phosphatase: 54 U/L (ref 33–115)
BUN: 11 mg/dL (ref 7–25)
CALCIUM: 9.1 mg/dL (ref 8.6–10.2)
CHLORIDE: 104 mmol/L (ref 98–110)
CO2: 24 mmol/L (ref 20–31)
Creat: 0.67 mg/dL (ref 0.50–1.10)
Glucose, Bld: 140 mg/dL — ABNORMAL HIGH (ref 70–99)
Potassium: 4.2 mmol/L (ref 3.5–5.3)
Sodium: 138 mmol/L (ref 135–146)
Total Bilirubin: 0.3 mg/dL (ref 0.2–1.2)
Total Protein: 6.9 g/dL (ref 6.1–8.1)

## 2016-09-21 LAB — CBC WITH DIFFERENTIAL/PLATELET
BASOS PCT: 1 %
Basophils Absolute: 66 cells/uL (ref 0–200)
EOS ABS: 264 {cells}/uL (ref 15–500)
Eosinophils Relative: 4 %
HEMATOCRIT: 41.4 % (ref 35.0–45.0)
Hemoglobin: 13.6 g/dL (ref 12.0–15.0)
Lymphocytes Relative: 40 %
Lymphs Abs: 2640 cells/uL (ref 850–3900)
MCH: 29.5 pg (ref 27.0–33.0)
MCHC: 32.9 g/dL (ref 32.0–36.0)
MCV: 89.8 fL (ref 80.0–100.0)
MONO ABS: 264 {cells}/uL (ref 200–950)
MONOS PCT: 4 %
MPV: 9.9 fL (ref 7.5–12.5)
NEUTROS ABS: 3366 {cells}/uL (ref 1500–7800)
Neutrophils Relative %: 51 %
PLATELETS: 251 10*3/uL (ref 140–400)
RBC: 4.61 MIL/uL (ref 3.80–5.10)
RDW: 14 % (ref 11.0–15.0)
WBC: 6.6 10*3/uL (ref 3.8–10.8)

## 2016-09-21 LAB — TSH: TSH: 21.45 m[IU]/L — AB

## 2016-09-21 LAB — T4, FREE: Free T4: 0.8 ng/dL (ref 0.8–1.8)

## 2016-09-22 ENCOUNTER — Encounter: Payer: Self-pay | Admitting: Orthopaedic Surgery

## 2016-09-22 ENCOUNTER — Encounter: Payer: Self-pay | Admitting: Family Medicine

## 2016-09-22 LAB — HEMOGLOBIN A1C
HEMOGLOBIN A1C: 5.9 % — AB (ref <5.7)
MEAN PLASMA GLUCOSE: 123 mg/dL

## 2016-09-22 LAB — T3: T3, Total: 87 ng/dL (ref 76–181)

## 2016-09-23 MED ORDER — LEVOTHYROXINE SODIUM 137 MCG PO TABS: 137.0000 ug | ORAL_TABLET | Freq: Every day | ORAL | 1 refills | Status: DC

## 2016-09-25 ENCOUNTER — Other Ambulatory Visit: Payer: Self-pay | Admitting: Family Medicine

## 2016-09-29 ENCOUNTER — Ambulatory Visit (INDEPENDENT_AMBULATORY_CARE_PROVIDER_SITE_OTHER): Payer: BLUE CROSS/BLUE SHIELD

## 2016-09-29 ENCOUNTER — Ambulatory Visit (INDEPENDENT_AMBULATORY_CARE_PROVIDER_SITE_OTHER): Payer: BLUE CROSS/BLUE SHIELD | Admitting: Orthopaedic Surgery

## 2016-09-29 ENCOUNTER — Encounter: Payer: Self-pay | Admitting: Orthopaedic Surgery

## 2016-09-29 DIAGNOSIS — S92354D Nondisplaced fracture of fifth metatarsal bone, right foot, subsequent encounter for fracture with routine healing: Secondary | ICD-10-CM

## 2016-09-29 DIAGNOSIS — S92525D Nondisplaced fracture of medial phalanx of left lesser toe(s), subsequent encounter for fracture with routine healing: Secondary | ICD-10-CM | POA: Diagnosis not present

## 2016-09-29 DIAGNOSIS — S92522G Displaced fracture of medial phalanx of left lesser toe(s), subsequent encounter for fracture with delayed healing: Secondary | ICD-10-CM

## 2016-09-29 NOTE — Progress Notes (Signed)
Patient Charlotte Carter, female DOB:[birth year], 41 y.o. TSV:[id number]  Chief Complaint  Patient presents with  . Follow-up    RIGHT FOOT FX, LEFT LITTLE TOE FX    HPI  Charlotte Carter is a 41 y.o. female who has fracture of the left little toe and fracture of the base of the right fifth metatarsal.  She is doing much better. She has returned to work.  The little toe has a medial displaced intra-articular fracture of the middle phalanx.  She would need surgery to correct.  It is not bothering her much but we did discuss this and she made copies of the x-rays. HPI  There is no height or weight on file to calculate BMI.  ROS  Review of Systems  Past Medical History:  Diagnosis Date  . GDM (gestational diabetes mellitus)   . Hx of varicella   . Hyperlipidemia   . Hypothyroidism   . Rosacea   . Thyroid disease   . Vaginal Pap smear, abnormal     Past Surgical History:  Procedure Laterality Date  . CESAREAN SECTION N/A 04/06/2015   Procedure: CESAREAN SECTION;  Surgeon: Aloha Gell, MD;  Location: [facility];  Service: Obstetrics;  Laterality: N/A;  . WISDOM TOOTH EXTRACTION      Family History  Problem Relation Age of Onset  . Diabetes Mother   . Hypertension Mother   . Hyperlipidemia Mother   . Arthritis Mother   . Alcohol abuse Mother   . Hypothyroidism Mother   . Hypertension Father   . Hyperlipidemia Father   . Arthritis Father   . Alcohol abuse Father   . Heart disease Father   . Hypothyroidism Father   . Asthma Sister   . Hypertension Sister   . Diabetes Maternal Grandmother     Social History Social History  Substance Use Topics  . Smoking status: Former Research scientist (life sciences)  . Smokeless tobacco: Never Used  . Alcohol use 10.5 oz/week    21 Standard drinks or equivalent per week    No Known Allergies  Current Outpatient Prescriptions  Medication Sig Dispense Refill  . b complex vitamins tablet Take 1 tablet by mouth daily.    Marland Kitchen HYDROcodone-acetaminophen  (NORCO/VICODIN) 5-325 MG tablet One tablet by mouth every six hours as needed for pain.  Seven day limit 28 tablet 0  . levothyroxine (SYNTHROID, LEVOTHROID) 137 MCG tablet Take 1 tablet (137 mcg total) by mouth daily before breakfast. 30 tablet 1  . metFORMIN (GLUCOPHAGE) 500 MG tablet TAKE 1 TABLET BY MOUTH AT  BEDTIME 90 tablet 1  . Omega-3 Fatty Acids (FISH OIL) 1200 MG CAPS Take 1 capsule by mouth 2 (two) times daily.     . Prenatal Multivit-Min-Fe-FA (PRENATAL VITAMINS PO) Take 1 tablet by mouth daily.     . sertraline (ZOLOFT) 50 MG tablet TAKE 1 TABLET BY MOUTH  DAILY 90 tablet 1  . vitamin C (ASCORBIC ACID) 500 MG tablet Take 500 mg by mouth daily.    . Zinc Sulfate (ZINC 15 PO) Take by mouth.     No current facility-administered medications for this visit.      Physical Exam  Last menstrual period 09/21/2016, unknown if currently breastfeeding.  Constitutional: overall normal hygiene, normal nutrition, well developed, normal grooming, normal body habitus. Assistive device:none  Musculoskeletal: gait and station Limp none, muscle tone and strength are normal, no tremors or atrophy is present.  .  Neurological: coordination overall normal.  Deep tendon reflex/nerve stretch intact.  Sensation normal.  Cranial nerves II-XII intact.   Skin:   Normal overall no scars, lesions, ulcers or rashes. No psoriasis.  Psychiatric: Alert and oriented x 3.  Recent memory intact, remote memory unclear.  Normal mood and affect. Well groomed.  Good eye contact.  Cardiovascular: overall no swelling, no varicosities, no edema bilaterally, normal temperatures of the legs and arms, no clubbing, cyanosis and good capillary refill.  Lymphatic: palpation is normal.  The right foot has just slight tenderness laterally. She has no swelling, NV intact.  The left little toe has   The patient has been educated about the nature of the problem(s) and counseled on treatment options.  The patient appeared  to understand what I have discussed and is in agreement with it.  Encounter Diagnoses  Name Primary?  . Closed nondisplaced fracture of fifth metatarsal bone of right foot with routine healing, subsequent encounter Yes  . Closed nondisplaced fracture of middle phalanx of lesser toe of left foot with routine healing, subsequent encounter     PLAN Call if any problems.  Precautions discussed.  Continue current medications.   Return to clinic 1 month   Electronically Signed Sanjuana Kava, MD 6/14/201810:15 AM

## 2016-10-05 ENCOUNTER — Encounter: Payer: Self-pay | Admitting: Family Medicine

## 2016-10-05 ENCOUNTER — Ambulatory Visit (INDEPENDENT_AMBULATORY_CARE_PROVIDER_SITE_OTHER): Payer: BLUE CROSS/BLUE SHIELD | Admitting: Family Medicine

## 2016-10-05 ENCOUNTER — Encounter: Payer: BLUE CROSS/BLUE SHIELD | Admitting: Family Medicine

## 2016-10-05 VITALS — BP 122/68 | HR 78 | Temp 98.9°F | Resp 14 | Ht 70.0 in | Wt 305.0 lb

## 2016-10-05 DIAGNOSIS — F320 Major depressive disorder, single episode, mild: Secondary | ICD-10-CM

## 2016-10-05 DIAGNOSIS — E038 Other specified hypothyroidism: Secondary | ICD-10-CM

## 2016-10-05 DIAGNOSIS — Z6841 Body Mass Index (BMI) 40.0 and over, adult: Secondary | ICD-10-CM

## 2016-10-05 DIAGNOSIS — Z0000 Encounter for general adult medical examination without abnormal findings: Secondary | ICD-10-CM

## 2016-10-05 DIAGNOSIS — E782 Mixed hyperlipidemia: Secondary | ICD-10-CM

## 2016-10-05 DIAGNOSIS — R7303 Prediabetes: Secondary | ICD-10-CM

## 2016-10-05 MED ORDER — LIRAGLUTIDE -WEIGHT MANAGEMENT 18 MG/3ML ~~LOC~~ SOPN: 0.6000 mg | PEN_INJECTOR | Freq: Every day | SUBCUTANEOUS | 2 refills | Status: DC

## 2016-10-05 MED ORDER — SERTRALINE HCL 50 MG PO TABS: ORAL_TABLET | ORAL | 1 refills | Status: DC

## 2016-10-05 NOTE — Assessment & Plan Note (Signed)
increased dose with her weight gain Recheck TFT in 6 weeks

## 2016-10-05 NOTE — Patient Instructions (Addendum)
Decrease to 1/2 tablet of zoloft Start saxenda:  Week 1: 0.6mg  injected daily:   Week 2:  1.2 mg injected daily  Week 3:  1.8mg  injected daily  Week 4:  2.4mg  injected daily  Week 5:  3mg  injected daily F/U 6 weeks - 30 minute slot mole removal/labs

## 2016-10-05 NOTE — Assessment & Plan Note (Signed)
Diet change changes needed,continue fish oil

## 2016-10-05 NOTE — Assessment & Plan Note (Signed)
Try to taper off zoloft, decrease to 25mg  will see how she is doing at f/u visit

## 2016-10-05 NOTE — Progress Notes (Signed)
Subjective:    Patient ID: Charlotte Carter, female    DOB: Jul 05, 1975, 41 y.o.   MRN: [id number]  Patient presents for CPE (has had labs) Pt here for CPE Followed OB/GYN for PAP smear Due for Mammogram Immunizations reviewed- TDAP UTD  Recent fasting labs reviewed-  Hypothyroidism- TSH significantly elevated at  21, synthroid increased last week to 136mcg daily Borderline DM- A1C 5.9% ,previous 5.8% before after pregancy, weight   Fracture of foot- Released from orthopedic she's been back at work. She still has a broken toe on the left fifth digit there laid in his heel and tenderness is not one surgical intervention.  Depression- on zoloft 50mg  she feels like she is handling things fairly well despite still adjusting to her schedule him with her toddler. She would like to try to come off the medication and see how she does.  Obesity she is gaining 40 pounds the past year she admits to eating out for lunch as well as eating late dinners because of her schedule with her child and her husband. She has not been exercising either.  She has a spot on her right lower back has been irritated by her [employer] present for a few years    Review Of Systems:  GEN- denies fatigue, fever, weight loss,weakness, recent illness HEENT- denies eye drainage, change in vision, nasal discharge, CVS- denies chest pain, palpitations RESP- denies SOB, cough, wheeze ABD- denies N/V, change in stools, abd pain GU- denies dysuria, hematuria, dribbling, incontinence MSK- +joint pain, muscle aches, injury Neuro- denies headache, dizziness, syncope, seizure activity       Objective:    BP 122/68   Pulse 78   Temp 98.9 F (37.2 C) (Oral)   Resp 14   Ht 5\' 10"  (1.778 m)   Wt (!) 305 lb (138.3 kg)   LMP 09/21/2016 (Approximate) Comment: irregular  SpO2 98%   BMI 43.76 kg/m  GEN- NAD, alert and oriented x3,obese HEENT- PERRL, EOMI, non injected sclera, pink conjunctiva, MMM, oropharynx  clear Neck- Supple, no thyromegaly CVS- RRR, no murmur RESP-CTAB ABD-NABS,soft,NT,ND Psych- normal affect and mood  Skin- irritated raised erythematous nevus, NT/ small folliculitis in bilat axilla, few scattered acne lesions on back  EXT- No edema Pulses- Radial, DP- 2+        Assessment & Plan:      Problem List Items Addressed This Visit    Obesity   Prediabetes    Continue metformin, long discussion on diet and overeating late snacking Will try Saxenda for weight loss Discussed use of medication and side effects       Hypothyroidism    increased dose with her weight gain Recheck TFT in 6 weeks       Hyperlipidemia    Diet change changes needed,continue fish oil      Depression, major, single episode, mild (HCC)    Try to taper off zoloft, decrease to 25mg  will see how she is doing at f/u visit      Relevant Medications   sertraline (ZOLOFT) 50 MG tablet    Other Visit Diagnoses    Routine general medical examination at a health care facility    -  Primary   CPE done, immunizations UTD, f/u GYN for her mammo at next visit in [place] for neoplasm shave biopsy      Note: This dictation was prepared with Dragon dictation along with smaller phrase technology. Any transcriptional errors that result from this process are unintentional.

## 2016-10-05 NOTE — Assessment & Plan Note (Signed)
Continue metformin, long discussion on diet and overeating late snacking Will try Saxenda for weight loss Discussed use of medication and side effects

## 2016-10-06 ENCOUNTER — Encounter: Payer: Self-pay | Admitting: Family Medicine

## 2016-10-16 ENCOUNTER — Encounter: Payer: Self-pay | Admitting: Family Medicine

## 2016-10-17 ENCOUNTER — Telehealth: Payer: Self-pay | Admitting: *Deleted

## 2016-10-17 NOTE — Telephone Encounter (Signed)
Received request from pharmacy for [name] on Saxenda.   PA submitted.   Dx: E66.1- obesity.   Patient can't use Belviq or Contrave D/T Depression (F32). Patient can't use Xenical D/T Hypothyroidism (E03.8).  Patient BMI 43.9. Patient has engaged in trial of behavior modification and caloric reduction >6 months and failed to achieve desired weight loss. Patient will continue with behavior modification and caloric reduction. Patient also has elevated fasting glucose that may lead to pre-diabetes. Charlotte Carter is the best choice for her weight loss goals.

## 2016-10-17 NOTE — Telephone Encounter (Signed)
[facility] is reviewing your PA request. Typically an electronic response will be received within 72 hours. To check for an update later, open this request from your dashboard. 

## 2016-10-17 NOTE — Telephone Encounter (Signed)
Received PA determination.   PA cancelled. Patient insurance is not correct. Call placed to pharmacy.  RxBIN: [id number]  ID: [id number].  PA submitted.

## 2016-10-17 NOTE — Telephone Encounter (Signed)
Your information has been submitted to [employer] [place]. [employer] [place] will review the request and fax you a determination directly, typically within 3 business days of your submission once all necessary information is received.  If [employer] [place] has not responded in 3 business days or if you have any questions about your submission, contact [employer] [place] at [phone]. 

## 2016-10-21 ENCOUNTER — Encounter: Payer: Self-pay | Admitting: Family Medicine

## 2016-10-21 MED ORDER — LIRAGLUTIDE 18 MG/3ML ~~LOC~~ SOPN: 1.8000 mg | PEN_INJECTOR | Freq: Every day | SUBCUTANEOUS | 11 refills | Status: DC

## 2016-10-21 NOTE — Telephone Encounter (Signed)
Prescription sent to pharmacy.   Patient aware per MyChart.  

## 2016-10-21 NOTE — Telephone Encounter (Signed)
Received PA determination.   PA denied D/T plan exclusion.   MD please advise.

## 2016-10-21 NOTE — Telephone Encounter (Signed)
Send over Victoza start with 0.6 and titrate up to 1.8

## 2016-10-24 ENCOUNTER — Telehealth: Payer: Self-pay | Admitting: Orthopaedic Surgery

## 2016-10-24 NOTE — Telephone Encounter (Signed)
Pt called and canceled the appointment for 10-26-16.  She states she is doing much better and doesnt need to come in.

## 2016-10-25 MED ORDER — SERTRALINE HCL 50 MG PO TABS: ORAL_TABLET | ORAL | 1 refills | Status: DC

## 2016-10-25 MED ORDER — INSULIN PEN NEEDLE 31G X 8 MM MISC: 1 refills | Status: DC

## 2016-10-26 ENCOUNTER — Ambulatory Visit: Payer: BLUE CROSS/BLUE SHIELD | Admitting: Orthopaedic Surgery

## 2016-11-02 ENCOUNTER — Ambulatory Visit (INDEPENDENT_AMBULATORY_CARE_PROVIDER_SITE_OTHER): Payer: BLUE CROSS/BLUE SHIELD | Admitting: Family Medicine

## 2016-11-02 ENCOUNTER — Encounter: Payer: Self-pay | Admitting: Family Medicine

## 2016-11-02 VITALS — BP 128/64 | HR 82 | Temp 98.9°F | Resp 14 | Ht 70.0 in | Wt 301.0 lb

## 2016-11-02 DIAGNOSIS — L509 Urticaria, unspecified: Secondary | ICD-10-CM

## 2016-11-02 DIAGNOSIS — B372 Candidiasis of skin and nail: Secondary | ICD-10-CM | POA: Diagnosis not present

## 2016-11-02 MED ORDER — PREDNISONE 20 MG PO TABS: ORAL_TABLET | ORAL | 0 refills | Status: DC

## 2016-11-02 MED ORDER — METHYLPREDNISOLONE ACETATE 80 MG/ML IJ SUSP
80.0000 mg | Freq: Once | INTRAMUSCULAR | Status: AC
  Administered 2016-11-02: 80 mg via INTRAMUSCULAR

## 2016-11-02 MED ORDER — NYSTATIN 100000 UNIT/GM EX POWD: Freq: Four times a day (QID) | CUTANEOUS | 1 refills | Status: DC

## 2016-11-02 NOTE — Progress Notes (Signed)
   Subjective:    Patient ID: Charlotte Carter, female    DOB: 16-Jul-1975, 41 y.o.   MRN: [id number]  Patient presents for Hives (started on Saturday- noted to arms, legs, ankles)  Patient here with hives that started over the weekend. She was initially on [employer] does not cover therefore she was transitioned toVictoza 's been on these medications for about 3 weeks. This weekend she noted hives initially on her legs since spread to her arms and her trunk area very itchy in nature. They started to go down by themselves but then pop back out again this morning worse. She is not taking the Benadryl but has used cortisone cream. The only new agent is the injectable medication. She has not had any recent illness is any change in food soap or detergent or anything else in the skin. There is no other rash in the family. She denies any difficulty breathing denies any lip or tongue swelling.  She also asked about something to help with the erythema moisture beneath her breast area.   Skin tags, will be removed in August With her mole    Review Of Systems:  GEN- denies fatigue, fever, weight loss,weakness, recent illness HEENT- denies eye drainage, change in vision, nasal discharge, CVS- denies chest pain, palpitations RESP- denies SOB, cough, wheeze ABD- denies N/V, change in stools, abd pain GU- denies dysuria, hematuria, dribbling, incontinence MSK- denies joint pain, muscle aches, injury Neuro- denies headache, dizziness, syncope, seizure activity       Objective:    BP 128/64   Pulse 82   Temp 98.9 F (37.2 C) (Oral)   Resp 14   Ht 5\' 10"  (1.778 m)   Wt (!) 301 lb (136.5 kg)   SpO2 97%   BMI 43.19 kg/m  GEN- NAD, alert and oriented x3 Neck- Supple, noLAD CVS- RRR, no murmur RESP-CTAB Skin- generalized hives/urticaria SPARING face, neck, +excoriations  Erythema beneath breast, mild maceration, few skin tags in armpits and beneath breast  Ext- No edema         Assessment & Plan:      Problem List Items Addressed This Visit    None    Visit Diagnoses    Urticaria    -  Primary   Interesting timing, so not completely convinced this is Victoza as delayed reaction, at any rate, will D/C injection, given depo medrol, prednisone taper, benadyl At bedtime. She would like to rechallenge to see if he truly is with excessive forward and wait about 2 weeks and then she can try again at a low dose she does have a reaction and we definitely notice the medication.   Relevant Medications   methylPREDNISolone acetate (DEPO-MEDROL) injection 80 mg (Completed)   Intertriginous candidiasis       Nystatin powder   Relevant Medications   nystatin (NYSTATIN) powder      Note: This dictation was prepared with Dragon dictation along with smaller phrase technology. Any transcriptional errors that result from this process are unintentional.

## 2016-11-02 NOTE — Patient Instructions (Signed)
F/U as previous 

## 2016-11-10 ENCOUNTER — Encounter: Payer: Self-pay | Admitting: Family Medicine

## 2016-11-10 MED ORDER — PERMETHRIN 5 % EX CREA: TOPICAL_CREAM | CUTANEOUS | 0 refills | Status: DC

## 2016-11-10 MED ORDER — PREDNISONE 20 MG PO TABS: ORAL_TABLET | ORAL | 0 refills | Status: DC

## 2016-11-10 MED ORDER — FAMOTIDINE 20 MG PO TABS: 20.0000 mg | ORAL_TABLET | Freq: Two times a day (BID) | ORAL | 0 refills | Status: DC

## 2016-11-10 NOTE — Telephone Encounter (Signed)
MD made aware and recommendations are as follows: We will do a few things  Give her permthrin to wash with, to cover any scabies, mites  Pepcid 20mg  BID for 7 days, anti-histmaine  Prednisone 60mg  x 3 days, then 40mg  x 3 days, 20mg  x 3 days, 10mg  x 2 days    If this reoccurs we will get urgent dermatology appt  Do not start any other new medications  If any breathing problems, difficulty swallowing go to ER    Call placed to patient and patient made aware. Prescription sent to pharmacy.

## 2016-11-15 ENCOUNTER — Other Ambulatory Visit: Payer: Self-pay | Admitting: Family Medicine

## 2016-11-16 ENCOUNTER — Encounter: Payer: Self-pay | Admitting: Family Medicine

## 2016-11-17 ENCOUNTER — Encounter: Payer: Self-pay | Admitting: Family Medicine

## 2016-11-18 ENCOUNTER — Other Ambulatory Visit: Payer: Self-pay | Admitting: Family Medicine

## 2016-11-18 DIAGNOSIS — L509 Urticaria, unspecified: Secondary | ICD-10-CM

## 2016-11-18 DIAGNOSIS — R21 Rash and other nonspecific skin eruption: Secondary | ICD-10-CM

## 2016-11-18 NOTE — Progress Notes (Signed)
Recurrent rash , responds to prednisone but returns

## 2016-11-23 ENCOUNTER — Encounter: Payer: Self-pay | Admitting: Family Medicine

## 2016-11-23 ENCOUNTER — Ambulatory Visit (INDEPENDENT_AMBULATORY_CARE_PROVIDER_SITE_OTHER): Payer: BLUE CROSS/BLUE SHIELD | Admitting: Family Medicine

## 2016-11-23 VITALS — BP 122/78 | HR 82 | Temp 98.7°F | Resp 14 | Ht 70.0 in | Wt 303.0 lb

## 2016-11-23 DIAGNOSIS — L918 Other hypertrophic disorders of the skin: Secondary | ICD-10-CM | POA: Diagnosis not present

## 2016-11-23 DIAGNOSIS — R7303 Prediabetes: Secondary | ICD-10-CM

## 2016-11-23 DIAGNOSIS — D229 Melanocytic nevi, unspecified: Secondary | ICD-10-CM

## 2016-11-23 DIAGNOSIS — E038 Other specified hypothyroidism: Secondary | ICD-10-CM | POA: Diagnosis not present

## 2016-11-23 LAB — T3, FREE: T3 FREE: 2.7 pg/mL (ref 2.3–4.2)

## 2016-11-23 LAB — T4, FREE: FREE T4: 1.1 ng/dL (ref 0.8–1.8)

## 2016-11-23 LAB — TSH: TSH: 12.76 mIU/L — ABNORMAL HIGH

## 2016-11-23 NOTE — Progress Notes (Signed)
   Subjective:    Patient ID: Charlotte Carter, female    DOB: Sep 04, 1975, 41 y.o.   MRN: [id number]  Patient presents for Mole removal (lower back mole and skin tags) Patient here for mole removal and skin tag removal. She has multiple which are irritating her beneath her breasts and in her axilla. The mole in question has been on her right mid back for a year or more  but seems to be changing in size. Note seen by dermatology on Monday dx with scabies, also given antibiotics for her folliculitis/boils  She also needs repeat thyroid function studies her TSH was elevated at 21.4 her Synthroid was increased to 137 g daily.  Review Of Systems:  GEN- denies fatigue, fever, weight loss,weakness, recent illness HEENT- denies eye drainage, change in vision, nasal discharge, CVS- denies chest pain, palpitations RESP- denies SOB, cough, wheeze Neuro- denies headache, dizziness, syncope, seizure activity       Objective:    BP 122/78   Pulse 82   Temp 98.7 F (37.1 C) (Oral)   Resp 14   Ht 5\' 10"  (1.778 m)   Wt (!) 303 lb (137.4 kg)   SpO2 99%   BMI 43.48 kg/m  GEN- NAD, alert and oriented x3 Skin- irritated raised erythematous nevus right mid back < 0.5cm  ,  Multiple skins tags in right axilla, 1 right upper arm and beneath breast left side    Procedure- Skin Tag Removal multiple- 6 total from Right arm, right axilla/side chest wall, 2 on left beneath breast  Procedure explained to patient questions answered benefits and risks discussed verbal consent obtained. Antiseptic-betadine Anesthesia- Cold Spray  Tags clipped at base with scissors Minimal blood loss, Patient tolerated procedure well Bandage applied  Procedure- Mole removal / Shave bioopsy Right back mole  Procedure explained to patient questions answered benefits and risks discussed verbal consent obtained. Antiseptic-betadine Anesthesia-lidocaine 1% with Epi Razor used to shave off mole, pathology sent  Minimal  blood loss, drysol  touched to lesion due to persistent oozing Patient tolerated procedure well Bandage applied with triple antibiotic ointment            Assessment & Plan:      Problem List Items Addressed This Visit    Skin tags, multiple acquired    Removed at beside, tolerated procedure well  Shave biopsy performed on right mid back mole, sent to pathology Discussed after care   Note also on antibioics for her hidradnitis/folliculitis given doxycyline by dermatology      Prediabetes    Check urine micro As no diagnosis of true allergic reaction She can restart the victoza to help with weight loss Start 0.6mg  and increase to 1.8mg  as previous       Relevant Orders   Microalbumin / creatinine urine ratio   Hypothyroidism - Primary    Repeat TFT      Relevant Orders   TSH   T3, free   T4, free    Other Visit Diagnoses    Atypical nevus       Relevant Orders   Pathology Report      Note: This dictation was prepared with Dragon dictation along with smaller phrase technology. Any transcriptional errors that result from this process are unintentional.

## 2016-11-23 NOTE — Assessment & Plan Note (Signed)
Removed at beside, tolerated procedure well  Shave biopsy performed on right mid back mole, sent to pathology Discussed after care   Note also on antibioics for her hidradnitis/folliculitis given doxycyline by dermatology

## 2016-11-23 NOTE — Assessment & Plan Note (Signed)
Repeat TFT 

## 2016-11-23 NOTE — Assessment & Plan Note (Signed)
Check urine micro As no diagnosis of true allergic reaction She can restart the victoza to help with weight loss Start 0.6mg  and increase to 1.8mg  as previous

## 2016-11-23 NOTE — Patient Instructions (Signed)
Keep areas clean. Apply vaseline or triple antibioic ointment to incision on back  F/U pending results

## 2016-11-24 ENCOUNTER — Other Ambulatory Visit: Payer: Self-pay | Admitting: *Deleted

## 2016-11-24 DIAGNOSIS — R7989 Other specified abnormal findings of blood chemistry: Secondary | ICD-10-CM

## 2016-11-24 LAB — MICROALBUMIN / CREATININE URINE RATIO
Creatinine, Urine: 141 mg/dL (ref 20–320)
MICROALB/CREAT RATIO: 45 ug/mg{creat} — AB (ref <30)
Microalb, Ur: 6.3 mg/dL

## 2016-11-24 MED ORDER — LEVOTHYROXINE SODIUM 150 MCG PO TABS: 150.0000 ug | ORAL_TABLET | Freq: Every day | ORAL | 1 refills | Status: DC

## 2016-12-07 ENCOUNTER — Other Ambulatory Visit: Payer: Self-pay | Admitting: Family Medicine

## 2016-12-07 LAB — PATHOLOGY

## 2016-12-09 ENCOUNTER — Encounter: Payer: Self-pay | Admitting: Family Medicine

## 2016-12-09 MED ORDER — FLUCONAZOLE 150 MG PO TABS: 150.0000 mg | ORAL_TABLET | Freq: Once | ORAL | 1 refills | Status: AC

## 2016-12-14 ENCOUNTER — Other Ambulatory Visit: Payer: Self-pay | Admitting: Family Medicine

## 2016-12-14 DIAGNOSIS — D229 Melanocytic nevi, unspecified: Secondary | ICD-10-CM

## 2016-12-30 ENCOUNTER — Encounter: Payer: Self-pay | Admitting: Family Medicine

## 2016-12-30 ENCOUNTER — Other Ambulatory Visit: Payer: Self-pay

## 2016-12-30 DIAGNOSIS — R7989 Other specified abnormal findings of blood chemistry: Secondary | ICD-10-CM

## 2016-12-30 DIAGNOSIS — R7309 Other abnormal glucose: Secondary | ICD-10-CM

## 2016-12-30 DIAGNOSIS — E7800 Pure hypercholesterolemia, unspecified: Secondary | ICD-10-CM

## 2017-01-10 ENCOUNTER — Other Ambulatory Visit: Payer: Self-pay | Admitting: Family Medicine

## 2017-01-11 LAB — LIPID PANEL
CHOL/HDL RATIO: 6.3 (calc) — AB (ref <5.0)
CHOLESTEROL: 207 mg/dL — AB (ref <200)
HDL: 33 mg/dL — ABNORMAL LOW (ref >50)
LDL CHOLESTEROL (CALC): 140 mg/dL — AB
NON-HDL CHOLESTEROL (CALC): 174 mg/dL — AB (ref <130)
Triglycerides: 207 mg/dL — ABNORMAL HIGH (ref <150)

## 2017-01-11 LAB — BASIC METABOLIC PANEL WITH GFR
BUN: 14 mg/dL (ref 7–25)
CHLORIDE: 105 mmol/L (ref 98–110)
CO2: 28 mmol/L (ref 20–32)
Calcium: 9.5 mg/dL (ref 8.6–10.2)
Creat: 0.67 mg/dL (ref 0.50–1.10)
GFR, EST AFRICAN AMERICAN: 127 mL/min/{1.73_m2} (ref >=60)
GFR, EST NON AFRICAN AMERICAN: 110 mL/min/{1.73_m2} (ref >=60)
Glucose, Bld: 121 mg/dL — ABNORMAL HIGH (ref 65–99)
POTASSIUM: 4.4 mmol/L (ref 3.5–5.3)
SODIUM: 141 mmol/L (ref 135–146)

## 2017-01-11 LAB — TSH: TSH: 1.98 mIU/L

## 2017-01-11 LAB — T3, FREE: T3, Free: 3.6 pg/mL (ref 2.3–4.2)

## 2017-01-11 LAB — HEMOGLOBIN A1C W/OUT EAG: HEMOGLOBIN A1C: 6.1 %{Hb} — AB (ref <5.7)

## 2017-01-11 LAB — T4, FREE: FREE T4: 1.5 ng/dL (ref 0.8–1.8)

## 2017-01-14 ENCOUNTER — Encounter: Payer: Self-pay | Admitting: Family Medicine

## 2017-01-16 MED ORDER — LEVOTHYROXINE SODIUM 150 MCG PO TABS: 150.0000 ug | ORAL_TABLET | Freq: Every day | ORAL | 1 refills | Status: DC

## 2017-01-23 ENCOUNTER — Other Ambulatory Visit: Payer: Self-pay | Admitting: Family Medicine

## 2017-04-01 ENCOUNTER — Other Ambulatory Visit: Payer: Self-pay | Admitting: Family Medicine

## 2017-04-10 ENCOUNTER — Other Ambulatory Visit: Payer: Self-pay | Admitting: Family Medicine

## 2017-04-21 ENCOUNTER — Telehealth: Payer: Self-pay

## 2017-04-21 NOTE — Telephone Encounter (Signed)
Patient called complaining of having chills, stuffy nose,cough,pressure around ears, swollen glands, and wheezing. Patient did do a phone portal where they prescribed her azithromycin. Patient has no relief and is requesting to be  seen . Patient was advised that no appointments were avail today and that she could go to an urgent care for treatment  or I could schedule an appointment for 04/24/2017. Patient states she has to work 04/24/2017 and will go to an urgent care

## 2017-05-04 ENCOUNTER — Encounter: Payer: Self-pay | Admitting: Family Medicine

## 2017-05-06 ENCOUNTER — Other Ambulatory Visit: Payer: Self-pay | Admitting: Family Medicine

## 2017-05-24 ENCOUNTER — Encounter: Payer: Self-pay | Admitting: Family Medicine

## 2017-05-26 ENCOUNTER — Encounter: Payer: Self-pay | Admitting: *Deleted

## 2017-06-07 ENCOUNTER — Other Ambulatory Visit: Payer: Self-pay | Admitting: Family Medicine

## 2017-06-08 ENCOUNTER — Other Ambulatory Visit: Payer: Self-pay | Admitting: Family Medicine

## 2017-08-15 ENCOUNTER — Encounter: Payer: Self-pay | Admitting: Family Medicine

## 2017-09-29 ENCOUNTER — Other Ambulatory Visit: Payer: Self-pay | Admitting: Family Medicine

## 2017-09-30 DIAGNOSIS — J208 Acute bronchitis due to other specified organisms: Secondary | ICD-10-CM | POA: Diagnosis not present

## 2017-10-02 ENCOUNTER — Ambulatory Visit: Payer: 59 | Admitting: Family Medicine

## 2017-10-02 ENCOUNTER — Other Ambulatory Visit: Payer: Self-pay

## 2017-10-02 ENCOUNTER — Encounter: Payer: Self-pay | Admitting: Family Medicine

## 2017-10-02 VITALS — BP 122/64 | HR 94 | Temp 99.9°F | Resp 16 | Ht 70.0 in | Wt 287.0 lb

## 2017-10-02 DIAGNOSIS — J029 Acute pharyngitis, unspecified: Secondary | ICD-10-CM | POA: Diagnosis not present

## 2017-10-02 DIAGNOSIS — R69 Illness, unspecified: Secondary | ICD-10-CM | POA: Diagnosis not present

## 2017-10-02 DIAGNOSIS — J069 Acute upper respiratory infection, unspecified: Secondary | ICD-10-CM | POA: Diagnosis not present

## 2017-10-02 DIAGNOSIS — F320 Major depressive disorder, single episode, mild: Secondary | ICD-10-CM

## 2017-10-02 MED ORDER — MAGIC MOUTHWASH W/LIDOCAINE: 5.0000 mL | Freq: Four times a day (QID) | ORAL | 0 refills | Status: DC | PRN

## 2017-10-02 NOTE — Patient Instructions (Addendum)
Use magic mouthwash  You can complete the antibiotics since you started Use flonase Give note for Work for Friday, Monday and Tuesday, can return Wed the 19th  F/U as previous for CPE - fasting labs to be done that day

## 2017-10-02 NOTE — Assessment & Plan Note (Signed)
Continue zoloft, doing well  

## 2017-10-02 NOTE — Progress Notes (Signed)
   Subjective:    Patient ID: Charlotte Carter, female    DOB: [birth year], 42 y.o.   MRN: [id number]  Patient presents for Illness (x4 days- sore throat, productive cough with yellow to green mucus, body aches, fever (Tmax 102.5)- has been taking Amox 875mg  from [facility])  Pt here with sore throat, cough, fever, body aches   Was prescribed amoxicillin from [facility]   given amoxicillin on Saturday  Highest Fever 102 F  Taking tylenol and ibuprofen  Worst part in Sore throat   No known sick contacts  Has had  A little diarrhea    Taking Nyquil and Dayquil/ chlorospetic, Ricola , Cepcal  Note her last visit was in August of 2018, she has history of  gestatoional DM and now borderline DM post partum- last 1C 6.1% in Sept 2018, taking metformin  And injecting Victoza to help with weight loss Weight is down 16lbs since August   Has CPE in August for fasting labs   She is taking zoloft, doing well with this for depression   Review Of Systems:  GEN- denies fatigue, +fever, weight loss,weakness, recent illness HEENT- denies eye drainage, change in vision, nasal discharge, CVS- denies chest pain, palpitations RESP- denies SOB, cough, wheeze ABD- denies N/V, +change in stools, abd pain GU- denies dysuria, hematuria, dribbling, incontinence MSK- denies joint pain, muscle aches, injury Neuro- denies headache, dizziness, syncope, seizure activity       Objective:    BP 122/64   Pulse 94   Temp 99.9 F (37.7 C) (Oral)   Resp 16   Ht 5\' 10"  (1.778 m)   Wt 287 lb (130.2 kg)   SpO2 98%   BMI 41.18 kg/m  GEN- NAD, alert and oriented x3 HEENT- PERRL, EOMI, non injected sclera, pink conjunctiva, MMM, oropharynx injected, mild tonsilar enlargement, no exudates , TM clear bilat no effusion,  No maxillary sinus tenderness, Neck- Supple, + cervical  LAD CVS- RRR, no murmur RESP-CTAB EXT- No edema Pulses- Radial 2+   Strep neg       Assessment & Plan:      Problem List Items  Addressed This Visit    None    Visit Diagnoses    Pharyngitis, unspecified etiology    -  Primary   I think this is viral eitology, strep neg in office, push fluids, given magic mouthwash, can continue antibiotics until culture returns if neg, D/C, use floanse for post nasal drip Given note for work    Relevant Orders   STREP GROUP A AG, W/REFLEX TO CULT (Completed)   Viral URI       Relevant Medications   magic mouthwash w/lidocaine SOLN      Note: This dictation was prepared with Dragon dictation along with smaller Company secretary. Any transcriptional errors that result from this process are unintentional.

## 2017-10-03 ENCOUNTER — Other Ambulatory Visit: Payer: Self-pay | Admitting: *Deleted

## 2017-10-03 MED ORDER — INSULIN PEN NEEDLE 31G X 8 MM MISC: 1 refills | Status: DC

## 2017-10-04 ENCOUNTER — Encounter: Payer: Self-pay | Admitting: Family Medicine

## 2017-10-04 LAB — CULTURE, GROUP A STREP
MICRO NUMBER: 90722115
SPECIMEN QUALITY:: ADEQUATE

## 2017-10-04 LAB — STREP GROUP A AG, W/REFLEX TO CULT: STREPTOCOCCUS, GROUP A SCREEN (DIRECT): NOT DETECTED

## 2017-10-25 ENCOUNTER — Other Ambulatory Visit: Payer: Self-pay | Admitting: *Deleted

## 2017-10-25 MED ORDER — CLINDAMYCIN PHOSPHATE 1 % EX GEL: Freq: Two times a day (BID) | CUTANEOUS | 0 refills | Status: DC

## 2017-11-22 ENCOUNTER — Encounter: Payer: Self-pay | Admitting: Family Medicine

## 2017-11-22 DIAGNOSIS — R7989 Other specified abnormal findings of blood chemistry: Secondary | ICD-10-CM

## 2017-11-22 DIAGNOSIS — R7309 Other abnormal glucose: Secondary | ICD-10-CM

## 2017-11-22 DIAGNOSIS — Z0000 Encounter for general adult medical examination without abnormal findings: Secondary | ICD-10-CM

## 2017-11-22 DIAGNOSIS — Z13220 Encounter for screening for lipoid disorders: Secondary | ICD-10-CM

## 2017-11-22 DIAGNOSIS — E038 Other specified hypothyroidism: Secondary | ICD-10-CM

## 2017-11-22 DIAGNOSIS — E7800 Pure hypercholesterolemia, unspecified: Secondary | ICD-10-CM

## 2017-11-29 ENCOUNTER — Other Ambulatory Visit: Payer: 59

## 2017-11-29 DIAGNOSIS — Z0000 Encounter for general adult medical examination without abnormal findings: Secondary | ICD-10-CM

## 2017-11-29 DIAGNOSIS — E7800 Pure hypercholesterolemia, unspecified: Secondary | ICD-10-CM

## 2017-11-29 DIAGNOSIS — R7989 Other specified abnormal findings of blood chemistry: Secondary | ICD-10-CM | POA: Diagnosis not present

## 2017-11-29 DIAGNOSIS — R7309 Other abnormal glucose: Secondary | ICD-10-CM | POA: Diagnosis not present

## 2017-11-29 DIAGNOSIS — Z13220 Encounter for screening for lipoid disorders: Secondary | ICD-10-CM

## 2017-11-29 DIAGNOSIS — E038 Other specified hypothyroidism: Secondary | ICD-10-CM

## 2017-11-30 LAB — CBC WITH DIFFERENTIAL/PLATELET
BASOS PCT: 0.7 %
Basophils Absolute: 51 cells/uL (ref 0–200)
EOS PCT: 3 %
Eosinophils Absolute: 219 cells/uL (ref 15–500)
HCT: 41.7 % (ref 35.0–45.0)
HEMOGLOBIN: 13.8 g/dL (ref 11.7–15.5)
Lymphs Abs: 2314 cells/uL (ref 850–3900)
MCH: 28.9 pg (ref 27.0–33.0)
MCHC: 33.1 g/dL (ref 32.0–36.0)
MCV: 87.4 fL (ref 80.0–100.0)
MONOS PCT: 5.5 %
MPV: 10.7 fL (ref 7.5–12.5)
NEUTROS ABS: 4314 {cells}/uL (ref 1500–7800)
Neutrophils Relative %: 59.1 %
Platelets: 269 10*3/uL (ref 140–400)
RBC: 4.77 10*6/uL (ref 3.80–5.10)
RDW: 13.1 % (ref 11.0–15.0)
Total Lymphocyte: 31.7 %
WBC mixed population: 402 cells/uL (ref 200–950)
WBC: 7.3 10*3/uL (ref 3.8–10.8)

## 2017-11-30 LAB — TSH: TSH: 1.75 m[IU]/L

## 2017-11-30 LAB — LIPID PANEL
CHOL/HDL RATIO: 5.9 (calc) — AB (ref <5.0)
CHOLESTEROL: 188 mg/dL (ref <200)
HDL: 32 mg/dL — AB (ref >50)
LDL Cholesterol (Calc): 125 mg/dL (calc) — ABNORMAL HIGH
Non-HDL Cholesterol (Calc): 156 mg/dL (calc) — ABNORMAL HIGH (ref <130)
Triglycerides: 194 mg/dL — ABNORMAL HIGH (ref <150)

## 2017-11-30 LAB — COMPLETE METABOLIC PANEL WITH GFR
AG Ratio: 1.6 (calc) (ref 1.0–2.5)
ALBUMIN MSPROF: 4.2 g/dL (ref 3.6–5.1)
ALT: 15 U/L (ref 6–29)
AST: 14 U/L (ref 10–30)
Alkaline phosphatase (APISO): 54 U/L (ref 33–115)
BUN: 14 mg/dL (ref 7–25)
CO2: 25 mmol/L (ref 20–32)
CREATININE: 0.59 mg/dL (ref 0.50–1.10)
Calcium: 9.4 mg/dL (ref 8.6–10.2)
Chloride: 104 mmol/L (ref 98–110)
GFR, EST AFRICAN AMERICAN: 132 mL/min/{1.73_m2} (ref >=60)
GFR, Est Non African American: 114 mL/min/{1.73_m2} (ref >=60)
GLUCOSE: 143 mg/dL — AB (ref 65–99)
Globulin: 2.7 g/dL (calc) (ref 1.9–3.7)
Potassium: 4.7 mmol/L (ref 3.5–5.3)
Sodium: 139 mmol/L (ref 135–146)
Total Bilirubin: 0.4 mg/dL (ref 0.2–1.2)
Total Protein: 6.9 g/dL (ref 6.1–8.1)

## 2017-11-30 LAB — HEMOGLOBIN A1C
HEMOGLOBIN A1C: 5.9 %{Hb} — AB (ref <5.7)
Mean Plasma Glucose: 123 (calc)
eAG (mmol/L): 6.8 (calc)

## 2017-12-06 ENCOUNTER — Ambulatory Visit (INDEPENDENT_AMBULATORY_CARE_PROVIDER_SITE_OTHER): Payer: 59 | Admitting: Family Medicine

## 2017-12-06 ENCOUNTER — Encounter: Payer: Self-pay | Admitting: Family Medicine

## 2017-12-06 ENCOUNTER — Other Ambulatory Visit: Payer: Self-pay

## 2017-12-06 VITALS — BP 122/70 | HR 72 | Temp 98.4°F | Resp 14 | Ht 70.0 in | Wt 293.0 lb

## 2017-12-06 DIAGNOSIS — R7303 Prediabetes: Secondary | ICD-10-CM | POA: Diagnosis not present

## 2017-12-06 DIAGNOSIS — E782 Mixed hyperlipidemia: Secondary | ICD-10-CM | POA: Diagnosis not present

## 2017-12-06 DIAGNOSIS — Z6841 Body Mass Index (BMI) 40.0 and over, adult: Secondary | ICD-10-CM | POA: Diagnosis not present

## 2017-12-06 DIAGNOSIS — Z0000 Encounter for general adult medical examination without abnormal findings: Secondary | ICD-10-CM | POA: Diagnosis not present

## 2017-12-06 DIAGNOSIS — E038 Other specified hypothyroidism: Secondary | ICD-10-CM

## 2017-12-06 DIAGNOSIS — E66813 Obesity, class 3: Secondary | ICD-10-CM

## 2017-12-06 DIAGNOSIS — L0292 Furuncle, unspecified: Secondary | ICD-10-CM | POA: Diagnosis not present

## 2017-12-06 MED ORDER — METFORMIN HCL 500 MG PO TABS: 500.0000 mg | ORAL_TABLET | Freq: Every day | ORAL | 2 refills | Status: DC

## 2017-12-06 MED ORDER — CLINDAMYCIN PHOSPHATE 1 % EX SOLN: Freq: Two times a day (BID) | CUTANEOUS | 2 refills | Status: AC

## 2017-12-06 MED ORDER — LIRAGLUTIDE 18 MG/3ML ~~LOC~~ SOPN: 1.8000 mg | PEN_INJECTOR | Freq: Every day | SUBCUTANEOUS | 11 refills | Status: DC

## 2017-12-06 MED ORDER — INSULIN PEN NEEDLE 31G X 8 MM MISC: 1 refills | Status: DC

## 2017-12-06 MED ORDER — SERTRALINE HCL 50 MG PO TABS: 50.0000 mg | ORAL_TABLET | Freq: Every day | ORAL | 1 refills | Status: DC

## 2017-12-06 MED ORDER — LEVOTHYROXINE SODIUM 150 MCG PO TABS: 150.0000 ug | ORAL_TABLET | Freq: Every day | ORAL | 2 refills | Status: DC

## 2017-12-06 NOTE — Patient Instructions (Addendum)
Schedule with GYN/PAP  F/U 6 months

## 2017-12-06 NOTE — Assessment & Plan Note (Signed)
Improved, on metformin and victoza with history of gestational diabetes as well

## 2017-12-06 NOTE — Assessment & Plan Note (Signed)
Clindamycin given to use topically

## 2017-12-06 NOTE — Progress Notes (Signed)
   Subjective:    Patient ID: Charlotte Carter, female    DOB: 06-27-1975, 43 y.o.   MRN: [id number]  Patient presents for CPE (is not fasting) Patient here for complete physical exam. She does have history of abnormal Pap smear.  Her GYN is Due for mammogram Immunizations tetanus is up-to-date Borderline diabetes mellitus -history of gestational diabetes.  She is currently on Victoza 1.8 mg daily/ to also help with weight loss her last A1c is 5.9% on her recent fasting labs. -Weight is going up, she stress eats taking care of her parents, she is not working out   Needs clindamycin changed to the solution for armpits   Hyperlipidemia her triglycerides are minimally improved but LDL did come down from 142 125 Hypothyroidism thyroid function at goal taking Synthroid  Labs reviewed at the bedside  Review Of Systems:  GEN- denies fatigue, fever, weight loss,weakness, recent illness HEENT- denies eye drainage, change in vision, nasal discharge, CVS- denies chest pain, palpitations RESP- denies SOB, cough, wheeze ABD- denies N/V, change in stools, abd pain GU- denies dysuria, hematuria, dribbling, incontinence MSK- denies joint pain, muscle aches, injury Neuro- denies headache, dizziness, syncope, seizure activity       Objective:    BP 122/70   Pulse 72   Temp 98.4 F (36.9 C) (Oral)   Resp 14   Ht 5\' 10"  (1.778 m)   Wt 293 lb (132.9 kg)   SpO2 96%   BMI 42.04 kg/m  GEN- NAD, alert and oriented x3,obese  HEENT- PERRL, EOMI, non injected sclera, pink conjunctiva, MMM, oropharynx clear Neck- Supple, no thyromegaly CVS- RRR, no murmur RESP-CTAB ABD-NABS,soft,NT,ND Skin- few ingrown hairs and irritation bilat axilla EXT- No edema Pulses- Radial, DP- 2+        Assessment & Plan:      Problem List Items Addressed This Visit      Unprioritized   Boils    Clindamycin given to use topically       Hyperlipidemia    Improved,needs better diet control in general       Hypothyroidism    TFT at goal, no change synthroid dose      Relevant Medications   levothyroxine (SYNTHROID, LEVOTHROID) 150 MCG tablet   Obesity   Relevant Medications   metFORMIN (GLUCOPHAGE) 500 MG tablet   liraglutide (VICTOZA) 18 MG/3ML SOPN   Prediabetes    Improved, on metformin and victoza with history of gestational diabetes as well        Other Visit Diagnoses    Routine general medical examination at a health care facility    -  Primary   CPE done, schedule with OB/GYN for PAP and Mammogram , immunizations UTD, discussed her beer drinking which she enjoys crafts but this is leading to weight gain      Note: This dictation was prepared with Dragon dictation along with smaller phrase technology. Any transcriptional errors that result from this process are unintentional.

## 2017-12-06 NOTE — Assessment & Plan Note (Signed)
Improved,needs better diet control in general

## 2017-12-06 NOTE — Assessment & Plan Note (Signed)
TFT at goal, no change synthroid dose

## 2017-12-13 ENCOUNTER — Encounter: Payer: BLUE CROSS/BLUE SHIELD | Admitting: Family Medicine

## 2017-12-27 DIAGNOSIS — Z1231 Encounter for screening mammogram for malignant neoplasm of breast: Secondary | ICD-10-CM | POA: Diagnosis not present

## 2017-12-27 DIAGNOSIS — R87610 Atypical squamous cells of undetermined significance on cytologic smear of cervix (ASC-US): Secondary | ICD-10-CM | POA: Diagnosis not present

## 2017-12-27 DIAGNOSIS — Z01419 Encounter for gynecological examination (general) (routine) without abnormal findings: Secondary | ICD-10-CM | POA: Diagnosis not present

## 2017-12-27 DIAGNOSIS — Z6841 Body Mass Index (BMI) 40.0 and over, adult: Secondary | ICD-10-CM | POA: Diagnosis not present

## 2017-12-27 LAB — HM MAMMOGRAPHY

## 2018-01-03 LAB — HM PAP SMEAR

## 2018-05-12 ENCOUNTER — Other Ambulatory Visit: Payer: Self-pay | Admitting: Family Medicine

## 2018-05-30 DIAGNOSIS — Z0100 Encounter for examination of eyes and vision without abnormal findings: Secondary | ICD-10-CM | POA: Diagnosis not present

## 2018-05-30 DIAGNOSIS — H5210 Myopia, unspecified eye: Secondary | ICD-10-CM | POA: Diagnosis not present

## 2018-05-30 DIAGNOSIS — E119 Type 2 diabetes mellitus without complications: Secondary | ICD-10-CM | POA: Diagnosis not present

## 2018-05-30 DIAGNOSIS — H43812 Vitreous degeneration, left eye: Secondary | ICD-10-CM | POA: Diagnosis not present

## 2018-07-06 ENCOUNTER — Encounter: Payer: Self-pay | Admitting: Family Medicine

## 2018-07-06 MED ORDER — LEVOTHYROXINE SODIUM 150 MCG PO TABS: 150.0000 ug | ORAL_TABLET | Freq: Every day | ORAL | 0 refills | Status: DC

## 2018-07-06 MED ORDER — METFORMIN HCL 500 MG PO TABS: 500.0000 mg | ORAL_TABLET | Freq: Every day | ORAL | 0 refills | Status: DC

## 2018-07-06 MED ORDER — SERTRALINE HCL 50 MG PO TABS: 50.0000 mg | ORAL_TABLET | Freq: Every day | ORAL | 0 refills | Status: DC

## 2018-07-22 ENCOUNTER — Encounter: Payer: Self-pay | Admitting: Family Medicine

## 2018-07-23 MED ORDER — SEMAGLUTIDE(0.25 OR 0.5MG/DOS) 2 MG/1.5ML ~~LOC~~ SOPN: 0.2500 mg | PEN_INJECTOR | SUBCUTANEOUS | 3 refills | Status: DC

## 2018-07-24 ENCOUNTER — Encounter: Payer: Self-pay | Admitting: Family Medicine

## 2018-07-24 MED ORDER — LANCETS MISC: 1 refills | Status: AC

## 2018-07-24 MED ORDER — BLOOD GLUCOSE TEST VI STRP: ORAL_STRIP | 1 refills | Status: AC

## 2018-07-24 MED ORDER — BLOOD GLUCOSE SYSTEM PAK KIT: PACK | 1 refills | Status: AC

## 2018-08-19 ENCOUNTER — Encounter: Payer: Self-pay | Admitting: Family Medicine

## 2018-09-26 ENCOUNTER — Encounter: Payer: Self-pay | Admitting: Family Medicine

## 2018-09-26 DIAGNOSIS — R7309 Other abnormal glucose: Secondary | ICD-10-CM

## 2018-09-26 DIAGNOSIS — E038 Other specified hypothyroidism: Secondary | ICD-10-CM

## 2018-09-26 DIAGNOSIS — E7800 Pure hypercholesterolemia, unspecified: Secondary | ICD-10-CM

## 2018-09-26 DIAGNOSIS — E782 Mixed hyperlipidemia: Secondary | ICD-10-CM

## 2018-09-26 DIAGNOSIS — R7989 Other specified abnormal findings of blood chemistry: Secondary | ICD-10-CM

## 2018-09-26 DIAGNOSIS — R7303 Prediabetes: Secondary | ICD-10-CM

## 2018-10-10 ENCOUNTER — Other Ambulatory Visit: Payer: Self-pay | Admitting: Family Medicine

## 2018-10-11 LAB — CBC WITH DIFFERENTIAL/PLATELET
Absolute Monocytes: 315 cells/uL (ref 200–950)
Basophils Absolute: 70 cells/uL (ref 0–200)
Basophils Relative: 1 %
Eosinophils Absolute: 189 cells/uL (ref 15–500)
Eosinophils Relative: 2.7 %
HCT: 42 % (ref 35.0–45.0)
Hemoglobin: 14.1 g/dL (ref 11.7–15.5)
Lymphs Abs: 2114 cells/uL (ref 850–3900)
MCH: 30.5 pg (ref 27.0–33.0)
MCHC: 33.6 g/dL (ref 32.0–36.0)
MCV: 90.7 fL (ref 80.0–100.0)
MPV: 11.2 fL (ref 7.5–12.5)
Monocytes Relative: 4.5 %
Neutro Abs: 4312 cells/uL (ref 1500–7800)
Neutrophils Relative %: 61.6 %
Platelets: 252 10*3/uL (ref 140–400)
RBC: 4.63 10*6/uL (ref 3.80–5.10)
RDW: 13.3 % (ref 11.0–15.0)
Total Lymphocyte: 30.2 %
WBC: 7 10*3/uL (ref 3.8–10.8)

## 2018-10-11 LAB — COMPLETE METABOLIC PANEL WITH GFR
AG Ratio: 1.9 (calc) (ref 1.0–2.5)
ALT: 26 U/L (ref 6–29)
AST: 25 U/L (ref 10–30)
Albumin: 4.4 g/dL (ref 3.6–5.1)
Alkaline phosphatase (APISO): 57 U/L (ref 31–125)
BUN: 11 mg/dL (ref 7–25)
CO2: 25 mmol/L (ref 20–32)
Calcium: 9.4 mg/dL (ref 8.6–10.2)
Chloride: 102 mmol/L (ref 98–110)
Creat: 0.61 mg/dL (ref 0.50–1.10)
GFR, Est African American: 130 mL/min/{1.73_m2} (ref >=60)
GFR, Est Non African American: 112 mL/min/{1.73_m2} (ref >=60)
Globulin: 2.3 g/dL (calc) (ref 1.9–3.7)
Glucose, Bld: 140 mg/dL — ABNORMAL HIGH (ref 65–99)
Potassium: 4.3 mmol/L (ref 3.5–5.3)
Sodium: 140 mmol/L (ref 135–146)
Total Bilirubin: 0.4 mg/dL (ref 0.2–1.2)
Total Protein: 6.7 g/dL (ref 6.1–8.1)

## 2018-10-11 LAB — LIPID PANEL
Cholesterol: 209 mg/dL — ABNORMAL HIGH (ref <200)
HDL: 34 mg/dL — ABNORMAL LOW (ref >=50)
LDL Cholesterol (Calc): 133 mg/dL (calc) — ABNORMAL HIGH
Non-HDL Cholesterol (Calc): 175 mg/dL (calc) — ABNORMAL HIGH (ref <130)
Total CHOL/HDL Ratio: 6.1 (calc) — ABNORMAL HIGH (ref <5.0)
Triglycerides: 296 mg/dL — ABNORMAL HIGH (ref <150)

## 2018-10-11 LAB — MICROALBUMIN / CREATININE URINE RATIO
Creatinine, Urine: 120 mg/dL (ref 20–275)
Microalb Creat Ratio: 26 mcg/mg creat (ref <30)
Microalb, Ur: 3.1 mg/dL

## 2018-10-11 LAB — TSH: TSH: 12.71 mIU/L — ABNORMAL HIGH

## 2018-10-11 LAB — HEMOGLOBIN A1C
Hgb A1c MFr Bld: 6.1 % of total Hgb — ABNORMAL HIGH (ref <5.7)
Mean Plasma Glucose: 128 (calc)
eAG (mmol/L): 7.1 (calc)

## 2018-10-17 ENCOUNTER — Other Ambulatory Visit: Payer: Self-pay

## 2018-10-17 ENCOUNTER — Encounter: Payer: Self-pay | Admitting: Family Medicine

## 2018-10-17 ENCOUNTER — Ambulatory Visit (INDEPENDENT_AMBULATORY_CARE_PROVIDER_SITE_OTHER): Payer: 59 | Admitting: Family Medicine

## 2018-10-17 VITALS — BP 138/72 | HR 82 | Temp 98.4°F | Resp 16 | Ht 70.0 in | Wt 303.0 lb

## 2018-10-17 DIAGNOSIS — R7303 Prediabetes: Secondary | ICD-10-CM

## 2018-10-17 DIAGNOSIS — F419 Anxiety disorder, unspecified: Secondary | ICD-10-CM | POA: Insufficient documentation

## 2018-10-17 DIAGNOSIS — Z0001 Encounter for general adult medical examination with abnormal findings: Secondary | ICD-10-CM

## 2018-10-17 DIAGNOSIS — E782 Mixed hyperlipidemia: Secondary | ICD-10-CM | POA: Diagnosis not present

## 2018-10-17 DIAGNOSIS — Z6841 Body Mass Index (BMI) 40.0 and over, adult: Secondary | ICD-10-CM

## 2018-10-17 DIAGNOSIS — F320 Major depressive disorder, single episode, mild: Secondary | ICD-10-CM

## 2018-10-17 DIAGNOSIS — E038 Other specified hypothyroidism: Secondary | ICD-10-CM | POA: Diagnosis not present

## 2018-10-17 DIAGNOSIS — Z0000 Encounter for general adult medical examination without abnormal findings: Secondary | ICD-10-CM

## 2018-10-17 MED ORDER — ALPRAZOLAM 0.5 MG PO TABS: 0.5000 mg | ORAL_TABLET | Freq: Two times a day (BID) | ORAL | 1 refills | Status: DC | PRN

## 2018-10-17 MED ORDER — ATORVASTATIN CALCIUM 20 MG PO TABS: 20.0000 mg | ORAL_TABLET | Freq: Every day | ORAL | 3 refills | Status: DC

## 2018-10-17 MED ORDER — METFORMIN HCL 500 MG PO TABS: 500.0000 mg | ORAL_TABLET | Freq: Two times a day (BID) | ORAL | 2 refills | Status: DC

## 2018-10-17 MED ORDER — SERTRALINE HCL 100 MG PO TABS: 100.0000 mg | ORAL_TABLET | Freq: Every day | ORAL | 2 refills | Status: DC

## 2018-10-17 MED ORDER — LEVOTHYROXINE SODIUM 175 MCG PO TABS: 175.0000 ug | ORAL_TABLET | Freq: Every day | ORAL | 1 refills | Status: DC

## 2018-10-17 NOTE — Assessment & Plan Note (Signed)
His metformin to 500 mg twice a day.  She would benefit from significant weight loss dietary changes which we have discussed.

## 2018-10-17 NOTE — Assessment & Plan Note (Signed)
Depression and anxiety are at the center of her a lot of her stresses along with marital issues.  I recommend increasing her Zoloft to 100 mg at bedtime we will also give her alprazolam to use to help with sleep and as an adjunct instead of her using other substances.  We will use this as needed.

## 2018-10-17 NOTE — Assessment & Plan Note (Signed)
Increase Synthroid to 175 mcg daily. He is consistent with her medication.  I think her weight gain contributes to the thyroid being off as well.

## 2018-10-17 NOTE — Progress Notes (Signed)
Subjective:    Patient ID: Charlotte Carter, female    DOB: Sep 24, 1975, 43 y.o.   MRN: [id number]  Patient presents for Annual Exam (is fasting) and Anxiety (increased anxiety and stress d/t COVID, parents, childcare, marital stresses)  Here for complete physical exam.  She had recent fasting labs which were reviewed at the bedside. He has multiple ailments and concerns.  She has not followed up for her multiple issues.  Currently taking levothyroxine 150 mcg once a day thyroid was significantly off.  Today shows elevated at 12.  She has gained 10 pounds since last August.  She has been under significant stress.  Has had increased anxiety.  She is contemplating complete separation from her husband.  She is caring for her parents she is also under a new company for her job and that is been stressful.  She has been taking Zoloft 50 mg but this is not been helping.  She has been smoking marijuana for over a year to help "calm her nerves but is afraid she may lose her job over this so recently quit.  She is asking for something to help with her nerves.  She is borderline diabetic her A1c was at 6.1% but she wants to get it back to completely normal.  She is currently on metformin she was unable to afford the Ozempic once a week which did help control her appetite.  Her lipids were significantly elevated she has strong family history of heart disease she is interested in going on statin drug as she knows she has not been eating well and will take time to get her cholesterol under control otherwise.  She has some swelling and pain along her tibial tendon no specific injury that is actually now improved.  Pap smear last done in 2015 have a GYN she does have history of abnormal Pap smear.  Immunizations tetanus booster up-to-date influenza up-to-date Review Of Systems:  GEN- denies fatigue, fever, weight loss,weakness, recent illness HEENT- denies eye drainage, change in vision, nasal discharge, CVS-  denies chest pain, palpitations RESP- denies SOB, cough, wheeze ABD- denies N/V, change in stools, abd pain GU- denies dysuria, hematuria, dribbling, incontinence MSK- denies joint pain, muscle aches, injury Neuro- denies headache, dizziness, syncope, seizure activity       Objective:    BP 138/72   Pulse 82   Temp 98.4 F (36.9 C) (Oral)   Resp 16   Ht 5\' 10"  (1.778 m)   Wt (!) 303 lb (137.4 kg)   SpO2 96%   BMI 43.48 kg/m  GEN- NAD, alert and oriented x3, pes HEENT- PERRL, EOMI, non injected sclera, pink conjunctiva, MMM, oropharynx clear Neck- Supple, no thyromegaly CVS- RRR, no murmur RESP-CTAB ABD-NABS,soft,NT,ND Psych-anxious appearing starting around to multiple topics.  No suicidal ideations well-groomed good eye contact EXT-trace lateral malleolus edema bilaterally  Pulses- Radial, DP- 2+        Assessment & Plan:      Problem List Items Addressed This Visit      Unprioritized   Anxiety   Relevant Medications   sertraline (ZOLOFT) 100 MG tablet   ALPRAZolam (XANAX) 0.5 MG tablet   Depression, major, single episode, mild (HCC)    Depression and anxiety are at the center of her a lot of her stresses along with marital issues.  I recommend increasing her Zoloft to 100 mg at bedtime we will also give her alprazolam to use to help with sleep and as an adjunct instead of her  using other substances.  We will use this as needed.      Relevant Medications   sertraline (ZOLOFT) 100 MG tablet   ALPRAZolam (XANAX) 0.5 MG tablet   Hyperlipemia    Heart Lipitor 20 mg at bedtime she has multiple risk factors and strong family history elevated LDL.  Recheck in 3 months      Relevant Medications   atorvastatin (LIPITOR) 20 MG tablet   Hypothyroidism - Primary    Increase Synthroid to 175 mcg daily. He is consistent with her medication.  I think her weight gain contributes to the thyroid being off as well.      Relevant Medications   levothyroxine (SYNTHROID)  175 MCG tablet   Other Relevant Orders   TSH   T3, free   T4, free   Obesity   Relevant Medications   metFORMIN (GLUCOPHAGE) 500 MG tablet   Prediabetes    His metformin to 500 mg twice a day.  She would benefit from significant weight loss dietary changes which we have discussed.       Other Visit Diagnoses    Routine general medical examination at a health care facility       Obtain records from GYN, immunizations UTD,repeat thyroid labs in 6 weeks      Note: This dictation was prepared with Dragon dictation along with smaller phrase technology. Any transcriptional errors that result from this process are unintentional.

## 2018-10-17 NOTE — Patient Instructions (Addendum)
Increase zoloft to 100mg  once a day  Xanax as needed  Synthroid increased to  176mcg once a day  Metformin increased to  500mg  twice a day  Get labs done in  6 weeks for thyroid Start lipitor 20mg  at bedtime for cholesterol  F/U 3 months for medications

## 2018-10-17 NOTE — Assessment & Plan Note (Signed)
Heart Lipitor 20 mg at bedtime she has multiple risk factors and strong family history elevated LDL.  Recheck in 3 months

## 2018-10-31 ENCOUNTER — Encounter: Payer: Self-pay | Admitting: *Deleted

## 2018-11-22 ENCOUNTER — Encounter: Payer: Self-pay | Admitting: Family Medicine

## 2018-11-27 ENCOUNTER — Other Ambulatory Visit: Payer: 59

## 2018-11-27 ENCOUNTER — Other Ambulatory Visit: Payer: Self-pay

## 2018-11-27 DIAGNOSIS — E782 Mixed hyperlipidemia: Secondary | ICD-10-CM

## 2018-11-27 DIAGNOSIS — R7309 Other abnormal glucose: Secondary | ICD-10-CM

## 2018-11-27 DIAGNOSIS — R7303 Prediabetes: Secondary | ICD-10-CM

## 2018-11-27 DIAGNOSIS — R7989 Other specified abnormal findings of blood chemistry: Secondary | ICD-10-CM

## 2018-11-27 DIAGNOSIS — E7800 Pure hypercholesterolemia, unspecified: Secondary | ICD-10-CM

## 2018-11-27 DIAGNOSIS — E038 Other specified hypothyroidism: Secondary | ICD-10-CM

## 2018-11-28 LAB — T3, FREE: T3, Free: 2.9 pg/mL (ref 2.3–4.2)

## 2018-11-28 LAB — T4, FREE: Free T4: 1.1 ng/dL (ref 0.8–1.8)

## 2018-11-29 ENCOUNTER — Other Ambulatory Visit: Payer: Self-pay | Admitting: *Deleted

## 2018-11-29 LAB — COMPLETE METABOLIC PANEL WITH GFR
AG Ratio: 1.7 (calc) (ref 1.0–2.5)
ALT: 25 U/L (ref 6–29)
AST: 25 U/L (ref 10–30)
Albumin: 4.5 g/dL (ref 3.6–5.1)
Alkaline phosphatase (APISO): 59 U/L (ref 31–125)
BUN: 17 mg/dL (ref 7–25)
CO2: 24 mmol/L (ref 20–32)
Calcium: 9.9 mg/dL (ref 8.6–10.2)
Chloride: 103 mmol/L (ref 98–110)
Creat: 0.81 mg/dL (ref 0.50–1.10)
GFR, Est African American: 104 mL/min/{1.73_m2} (ref >=60)
GFR, Est Non African American: 90 mL/min/{1.73_m2} (ref >=60)
Globulin: 2.7 g/dL (calc) (ref 1.9–3.7)
Glucose, Bld: 107 mg/dL — ABNORMAL HIGH (ref 65–99)
Potassium: 4.6 mmol/L (ref 3.5–5.3)
Sodium: 141 mmol/L (ref 135–146)
Total Bilirubin: 0.3 mg/dL (ref 0.2–1.2)
Total Protein: 7.2 g/dL (ref 6.1–8.1)

## 2018-11-29 LAB — CBC WITH DIFFERENTIAL/PLATELET
Absolute Monocytes: 428 cells/uL (ref 200–950)
Basophils Absolute: 73 cells/uL (ref 0–200)
Basophils Relative: 0.8 %
Eosinophils Absolute: 264 cells/uL (ref 15–500)
Eosinophils Relative: 2.9 %
HCT: 42.6 % (ref 35.0–45.0)
Hemoglobin: 14.1 g/dL (ref 11.7–15.5)
Lymphs Abs: 2366 cells/uL (ref 850–3900)
MCH: 30.3 pg (ref 27.0–33.0)
MCHC: 33.1 g/dL (ref 32.0–36.0)
MCV: 91.4 fL (ref 80.0–100.0)
MPV: 11.4 fL (ref 7.5–12.5)
Monocytes Relative: 4.7 %
Neutro Abs: 5970 cells/uL (ref 1500–7800)
Neutrophils Relative %: 65.6 %
Platelets: 255 10*3/uL (ref 140–400)
RBC: 4.66 10*6/uL (ref 3.80–5.10)
RDW: 13 % (ref 11.0–15.0)
Total Lymphocyte: 26 %
WBC: 9.1 10*3/uL (ref 3.8–10.8)

## 2018-11-29 LAB — HEMOGLOBIN A1C
Hgb A1c MFr Bld: 6.2 % of total Hgb — ABNORMAL HIGH (ref <5.7)
Mean Plasma Glucose: 131 (calc)
eAG (mmol/L): 7.3 (calc)

## 2018-11-29 LAB — TSH: TSH: 3.99 mIU/L

## 2018-11-29 LAB — LIPID PANEL
Cholesterol: 116 mg/dL (ref <200)
HDL: 32 mg/dL — ABNORMAL LOW (ref >=50)
LDL Cholesterol (Calc): 55 mg/dL (calc)
Non-HDL Cholesterol (Calc): 84 mg/dL (calc) (ref <130)
Total CHOL/HDL Ratio: 3.6 (calc) (ref <5.0)
Triglycerides: 217 mg/dL — ABNORMAL HIGH (ref <150)

## 2018-11-29 LAB — MICROALBUMIN / CREATININE URINE RATIO

## 2018-11-29 MED ORDER — LEVOTHYROXINE SODIUM 175 MCG PO TABS: 175.0000 ug | ORAL_TABLET | Freq: Every day | ORAL | 1 refills | Status: DC

## 2018-12-03 ENCOUNTER — Encounter: Payer: Self-pay | Admitting: Family Medicine

## 2018-12-26 LAB — HM MAMMOGRAPHY

## 2019-02-22 ENCOUNTER — Encounter: Payer: Self-pay | Admitting: Family Medicine

## 2019-02-22 ENCOUNTER — Other Ambulatory Visit: Payer: Self-pay

## 2019-02-22 DIAGNOSIS — Z20822 Contact with and (suspected) exposure to covid-19: Secondary | ICD-10-CM

## 2019-02-23 LAB — NOVEL CORONAVIRUS, NAA: SARS-CoV-2, NAA: NOT DETECTED

## 2019-03-06 ENCOUNTER — Other Ambulatory Visit: Payer: Self-pay

## 2019-03-06 ENCOUNTER — Other Ambulatory Visit: Payer: Self-pay | Admitting: Family Medicine

## 2019-03-06 ENCOUNTER — Other Ambulatory Visit: Payer: 59

## 2019-03-06 DIAGNOSIS — E038 Other specified hypothyroidism: Secondary | ICD-10-CM

## 2019-03-06 NOTE — Progress Notes (Signed)
Pt needs repeat TSH   Pt concerned thyroid is off, her meds look different from pharmacy, would like to recheck today

## 2019-03-07 LAB — TSH: TSH: 4.05 mIU/L

## 2019-04-22 ENCOUNTER — Encounter: Payer: Self-pay | Admitting: Family Medicine

## 2019-04-22 DIAGNOSIS — L0293 Carbuncle, unspecified: Secondary | ICD-10-CM

## 2019-04-22 DIAGNOSIS — Q828 Other specified congenital malformations of skin: Secondary | ICD-10-CM

## 2019-04-22 DIAGNOSIS — K130 Diseases of lips: Secondary | ICD-10-CM

## 2019-05-30 ENCOUNTER — Other Ambulatory Visit: Payer: Self-pay | Admitting: Family Medicine

## 2019-06-13 ENCOUNTER — Encounter: Payer: Self-pay | Admitting: *Deleted

## 2019-07-14 ENCOUNTER — Other Ambulatory Visit: Payer: Self-pay | Admitting: Family Medicine

## 2019-08-16 ENCOUNTER — Other Ambulatory Visit: Payer: Self-pay | Admitting: Family Medicine

## 2019-08-19 NOTE — Telephone Encounter (Signed)
Ok to refill??  Last office visit/ refill 10/17/2018, #1 refill.

## 2019-09-05 ENCOUNTER — Encounter: Payer: Self-pay | Admitting: Family Medicine

## 2019-09-05 MED ORDER — FLUCONAZOLE 150 MG PO TABS
150.0000 mg | ORAL_TABLET | Freq: Once | ORAL | 0 refills | Status: AC
Start: 2019-09-05 — End: 2019-09-05

## 2019-10-01 ENCOUNTER — Other Ambulatory Visit: Payer: Self-pay | Admitting: *Deleted

## 2019-10-01 MED ORDER — ATORVASTATIN CALCIUM 20 MG PO TABS: 20.0000 mg | ORAL_TABLET | Freq: Every day | ORAL | 0 refills | Status: DC

## 2019-10-08 ENCOUNTER — Encounter: Payer: Self-pay | Admitting: Family Medicine

## 2019-10-28 ENCOUNTER — Encounter: Payer: Self-pay | Admitting: Family Medicine

## 2019-10-28 DIAGNOSIS — L0292 Furuncle, unspecified: Secondary | ICD-10-CM

## 2019-10-28 DIAGNOSIS — L0293 Carbuncle, unspecified: Secondary | ICD-10-CM

## 2019-10-28 DIAGNOSIS — Q828 Other specified congenital malformations of skin: Secondary | ICD-10-CM

## 2019-10-28 DIAGNOSIS — L304 Erythema intertrigo: Secondary | ICD-10-CM

## 2019-10-28 NOTE — Telephone Encounter (Signed)
Referral placed.

## 2019-10-29 ENCOUNTER — Encounter: Payer: Self-pay | Admitting: Family Medicine

## 2019-10-29 ENCOUNTER — Other Ambulatory Visit: Payer: Self-pay | Admitting: *Deleted

## 2019-10-29 DIAGNOSIS — E038 Other specified hypothyroidism: Secondary | ICD-10-CM

## 2019-10-29 DIAGNOSIS — R7309 Other abnormal glucose: Secondary | ICD-10-CM

## 2019-10-29 DIAGNOSIS — Z6841 Body Mass Index (BMI) 40.0 and over, adult: Secondary | ICD-10-CM

## 2019-10-29 DIAGNOSIS — E782 Mixed hyperlipidemia: Secondary | ICD-10-CM

## 2019-10-29 DIAGNOSIS — R7989 Other specified abnormal findings of blood chemistry: Secondary | ICD-10-CM

## 2019-10-29 DIAGNOSIS — R7303 Prediabetes: Secondary | ICD-10-CM

## 2019-10-29 DIAGNOSIS — Z13220 Encounter for screening for lipoid disorders: Secondary | ICD-10-CM

## 2019-10-30 ENCOUNTER — Other Ambulatory Visit: Payer: Self-pay

## 2019-10-30 ENCOUNTER — Encounter: Payer: Self-pay | Admitting: Family Medicine

## 2019-10-30 ENCOUNTER — Ambulatory Visit (INDEPENDENT_AMBULATORY_CARE_PROVIDER_SITE_OTHER): Payer: 59 | Admitting: Family Medicine

## 2019-10-30 VITALS — BP 138/82 | HR 84 | Temp 97.6°F | Resp 14 | Ht 70.0 in | Wt 294.0 lb

## 2019-10-30 DIAGNOSIS — Z124 Encounter for screening for malignant neoplasm of cervix: Secondary | ICD-10-CM

## 2019-10-30 DIAGNOSIS — Z6841 Body Mass Index (BMI) 40.0 and over, adult: Secondary | ICD-10-CM

## 2019-10-30 DIAGNOSIS — F419 Anxiety disorder, unspecified: Secondary | ICD-10-CM

## 2019-10-30 DIAGNOSIS — E038 Other specified hypothyroidism: Secondary | ICD-10-CM

## 2019-10-30 DIAGNOSIS — E782 Mixed hyperlipidemia: Secondary | ICD-10-CM

## 2019-10-30 DIAGNOSIS — R7303 Prediabetes: Secondary | ICD-10-CM | POA: Diagnosis not present

## 2019-10-30 DIAGNOSIS — F331 Major depressive disorder, recurrent, moderate: Secondary | ICD-10-CM

## 2019-10-30 DIAGNOSIS — B3731 Acute candidiasis of vulva and vagina: Secondary | ICD-10-CM

## 2019-10-30 DIAGNOSIS — Z0001 Encounter for general adult medical examination with abnormal findings: Secondary | ICD-10-CM | POA: Diagnosis not present

## 2019-10-30 DIAGNOSIS — Z0000 Encounter for general adult medical examination without abnormal findings: Secondary | ICD-10-CM

## 2019-10-30 DIAGNOSIS — B373 Candidiasis of vulva and vagina: Secondary | ICD-10-CM

## 2019-10-30 LAB — COMPLETE METABOLIC PANEL WITH GFR
AG Ratio: 1.6 (calc) (ref 1.0–2.5)
ALT: 27 U/L (ref 6–29)
AST: 30 U/L (ref 10–30)
Albumin: 4.4 g/dL (ref 3.6–5.1)
Alkaline phosphatase (APISO): 57 U/L (ref 31–125)
BUN: 12 mg/dL (ref 7–25)
CO2: 28 mmol/L (ref 20–32)
Calcium: 9.7 mg/dL (ref 8.6–10.2)
Chloride: 102 mmol/L (ref 98–110)
Creat: 0.59 mg/dL (ref 0.50–1.10)
GFR, Est African American: 130 mL/min/{1.73_m2} (ref >=60)
GFR, Est Non African American: 112 mL/min/{1.73_m2} (ref >=60)
Globulin: 2.7 g/dL (calc) (ref 1.9–3.7)
Glucose, Bld: 134 mg/dL — ABNORMAL HIGH (ref 65–99)
Potassium: 4.2 mmol/L (ref 3.5–5.3)
Sodium: 141 mmol/L (ref 135–146)
Total Bilirubin: 0.5 mg/dL (ref 0.2–1.2)
Total Protein: 7.1 g/dL (ref 6.1–8.1)

## 2019-10-30 LAB — HEMOGLOBIN A1C
Hgb A1c MFr Bld: 6 % of total Hgb — ABNORMAL HIGH (ref <5.7)
Mean Plasma Glucose: 126 (calc)
eAG (mmol/L): 7 (calc)

## 2019-10-30 LAB — CBC WITH DIFFERENTIAL/PLATELET
Absolute Monocytes: 371 cells/uL (ref 200–950)
Basophils Absolute: 51 cells/uL (ref 0–200)
Basophils Relative: 0.8 %
Eosinophils Absolute: 160 cells/uL (ref 15–500)
Eosinophils Relative: 2.5 %
HCT: 42.7 % (ref 35.0–45.0)
Hemoglobin: 14.2 g/dL (ref 11.7–15.5)
Lymphs Abs: 1882 cells/uL (ref 850–3900)
MCH: 30.3 pg (ref 27.0–33.0)
MCHC: 33.3 g/dL (ref 32.0–36.0)
MCV: 91 fL (ref 80.0–100.0)
MPV: 10.7 fL (ref 7.5–12.5)
Monocytes Relative: 5.8 %
Neutro Abs: 3936 cells/uL (ref 1500–7800)
Neutrophils Relative %: 61.5 %
Platelets: 223 10*3/uL (ref 140–400)
RBC: 4.69 10*6/uL (ref 3.80–5.10)
RDW: 12.8 % (ref 11.0–15.0)
Total Lymphocyte: 29.4 %
WBC: 6.4 10*3/uL (ref 3.8–10.8)

## 2019-10-30 LAB — LIPID PANEL
Cholesterol: 134 mg/dL (ref <200)
HDL: 36 mg/dL — ABNORMAL LOW (ref >=50)
LDL Cholesterol (Calc): 70 mg/dL (calc)
Non-HDL Cholesterol (Calc): 98 mg/dL (calc) (ref <130)
Total CHOL/HDL Ratio: 3.7 (calc) (ref <5.0)
Triglycerides: 226 mg/dL — ABNORMAL HIGH (ref <150)

## 2019-10-30 LAB — TSH: TSH: 0.93 mIU/L

## 2019-10-30 LAB — WET PREP FOR TRICH, YEAST, CLUE

## 2019-10-30 MED ORDER — MIRENA (52 MG) 20 MCG/24HR IU IUD: 1.0000 | INTRAUTERINE_SYSTEM | Freq: Once | INTRAUTERINE | 0 refills | Status: AC

## 2019-10-30 MED ORDER — CLOTRIMAZOLE 3 2 % VA CREA: TOPICAL_CREAM | VAGINAL | 1 refills | Status: AC

## 2019-10-30 MED ORDER — SERTRALINE HCL 100 MG PO TABS: ORAL_TABLET | ORAL | 3 refills | Status: DC

## 2019-10-30 MED ORDER — ALPRAZOLAM 0.5 MG PO TABS: 0.5000 mg | ORAL_TABLET | Freq: Two times a day (BID) | ORAL | 1 refills | Status: DC | PRN

## 2019-10-30 MED ORDER — LEVOTHYROXINE SODIUM 175 MCG PO TABS: 175.0000 ug | ORAL_TABLET | Freq: Every day | ORAL | 3 refills | Status: DC

## 2019-10-30 MED ORDER — ATORVASTATIN CALCIUM 20 MG PO TABS: 20.0000 mg | ORAL_TABLET | Freq: Every day | ORAL | 0 refills | Status: DC

## 2019-10-30 MED ORDER — NYSTATIN-TRIAMCINOLONE 100000-0.1 UNIT/GM-% EX OINT: 1.0000 "application " | TOPICAL_OINTMENT | Freq: Two times a day (BID) | CUTANEOUS | 0 refills | Status: AC

## 2019-10-30 MED ORDER — METFORMIN HCL 500 MG PO TABS: ORAL_TABLET | ORAL | 3 refills | Status: DC

## 2019-10-30 NOTE — Assessment & Plan Note (Addendum)
TG chronically elevated, dietary changes Continue lipitor and omega 3

## 2019-10-30 NOTE — Progress Notes (Signed)
Subjective:    Patient ID: Charlotte Carter, female    DOB: 1975/12/03, 44 y.o.   MRN: [id number]  Patient presents for Annual Exam (has had labs) and Vaginal Irritation (yeast infection)  Pt here for CPE ,medications reviewed  fasting labs reviewed at bedside  She will be changing jobs and insurance will run out at the end of the month    Hypothyroidism- currently taking levothyroixine  160mcg once a day   Pre diabetes- A1C improved to 6%, taking metformin 500mg  once a day   - weight   GAD/MDD- currently on zoloft 100mg  daily and xanax   She is now seperated from her husband but he still lives in the same house and there is significant custody issues going on as well as property issues.  Her plan is to move to [place] where she can be closer to her family.   Her mother has transitioned to a SNF in [place], which has helped some of the stress being a caregiver  Hyperlipidemia- TG still elevated but LDL at goal   She is followed by dermatology - currently on keflex recurrent beneath the pannus.  She also gets intertrigo candidiasis.  She was prescribed nystatin triamcinolone which she is at detox from her gynecologist.  Keflex does not seem to work as well and she would like to go back on amoxicillin she has appointment to see her dermatologist today.  Immunizations- TDAP/FLU UTD covid-19 UTD  Pap Smear -normal 2019 - [facility]   Mammogram UTD Sept 2020   Had BV- treated with MetroGel by gynecology.  She also had yeast infection which she self treated and we also gave her Diflucan.  She still feels some irritation in the vaginal area and around the rectal area.  Review Of Systems:  GEN- denies fatigue, fever, weight loss,weakness, recent illness HEENT- denies eye drainage, change in vision, nasal discharge, CVS- denies chest pain, palpitations RESP- denies SOB, cough, wheeze ABD- denies N/V, change in stools, abd pain GU- denies dysuria, hematuria, dribbling,  incontinence MSK- denies joint pain, muscle aches, injury Neuro- denies headache, dizziness, syncope, seizure activity       Objective:    BP 138/82    Pulse 84    Temp 97.6 F (36.4 C) (Temporal)    Resp 14    Ht 5\' 10"  (1.778 m)    Wt 294 lb (133.4 kg)    SpO2 97%    BMI 42.18 kg/m  GEN- NAD, alert and oriented x3 HEENT- PERRL, EOMI, non injected sclera, pink conjunctiva, MMM, oropharynx clear, TM clear no effusion Neck- Supple, no thyromegaly CVS- RRR, no murmur RESP-CTAB ABD-NABS,soft,NT,ND Skin- multiple erythematous maculopaular lesions beneath pannus and in groin, few beneath breast GU- normal external genitalia, vaginal mucosa pink and moist, cervix visualized no growth,+ blood form os,  IUD strings seen, minimal thin clear discharge, no CMT, no ovarian masses, uterus normal size White discharge with irritation in labial creases and perineum, small fissures on perineum, rectal tag -NT Psych- stressed appearing, +anxious, no SI, well groomed, normal speech EXT- No edema Pulses- Radial, DP- 2+        Assessment & Plan:      Problem List Items Addressed This Visit      Unprioritized   Anxiety   Hyperlipidemia    TG chronically elevated, dietary changes Continue lipitor and omega 3       Hypothyroidism    Continue current dose, no changes      MDD (major depressive  disorder)    Significant stressors.  She is trying to sell her home she has some legal issues going on with her husband whom she is separated from.  They have custody concerns with regards to their daughter.  She is making a change in her jobs that it fits her schedule better.  She will be losing her insurance.  At this time we will continue the Zoloft that she does get benefit from it she will continue alprazolam as needed for sleep and anxiety.  sHe does have some support from her siblings      Obesity    Weight down 9lbs Continue MTF for pre diabetes, A1C improved       Prediabetes    Other  Visit Diagnoses    Routine general medical examination at a health care facility    -  Primary   CPE done, pap smear obtained ,reviewed labs at bedside    Cervical cancer screening       Relevant Orders   Pap IG w/ reflex to HPV when ASC-U   Vaginal yeast infection       externally in folds, perineum, topiacl clotrimazole given, BV has cleared    Relevant Medications   KEFLEX 500 MG capsule   nystatin-triamcinolone ointment (MYCOLOG)   Other Relevant Orders   WET PREP FOR [place], YEAST, CLUE (Completed)      Note: This dictation was prepared with Dragon dictation along with smaller phrase technology. Any transcriptional errors that result from this process are unintentional.

## 2019-10-30 NOTE — Assessment & Plan Note (Signed)
Continue current dose, no changes

## 2019-10-30 NOTE — Assessment & Plan Note (Signed)
Weight down 9lbs Continue MTF for pre diabetes, A1C improved

## 2019-10-30 NOTE — Assessment & Plan Note (Signed)
Significant stressors.  She is trying to sell her home she has some legal issues going on with her husband whom she is separated from.  They have custody concerns with regards to their daughter.  She is making a change in her jobs that it fits her schedule better.  She will be losing her insurance.  At this time we will continue the Zoloft that she does get benefit from it she will continue alprazolam as needed for sleep and anxiety.  sHe does have some support from her siblings

## 2019-10-30 NOTE — Patient Instructions (Signed)
F/U 6 months

## 2019-10-31 LAB — PAP IG W/ RFLX HPV ASCU

## 2019-11-20 ENCOUNTER — Encounter: Payer: 59 | Admitting: Family Medicine

## 2019-12-31 ENCOUNTER — Encounter: Payer: Self-pay | Admitting: Family Medicine

## 2020-01-01 MED ORDER — ATORVASTATIN CALCIUM 20 MG PO TABS: 20.0000 mg | ORAL_TABLET | Freq: Every day | ORAL | 3 refills | Status: DC

## 2020-01-01 MED ORDER — SERTRALINE HCL 100 MG PO TABS: ORAL_TABLET | ORAL | 3 refills | Status: AC

## 2020-02-09 ENCOUNTER — Encounter: Payer: Self-pay | Admitting: Family Medicine

## 2020-02-18 ENCOUNTER — Other Ambulatory Visit: Payer: Self-pay

## 2020-02-18 ENCOUNTER — Ambulatory Visit (INDEPENDENT_AMBULATORY_CARE_PROVIDER_SITE_OTHER): Payer: PRIVATE HEALTH INSURANCE | Admitting: Family Medicine

## 2020-02-18 ENCOUNTER — Encounter: Payer: Self-pay | Admitting: Family Medicine

## 2020-02-18 VITALS — BP 120/78 | HR 68 | Resp 12 | Ht 69.0 in | Wt 291.2 lb

## 2020-02-18 DIAGNOSIS — Z23 Encounter for immunization: Secondary | ICD-10-CM

## 2020-02-18 DIAGNOSIS — L719 Rosacea, unspecified: Secondary | ICD-10-CM

## 2020-02-18 DIAGNOSIS — L0292 Furuncle, unspecified: Secondary | ICD-10-CM

## 2020-02-18 DIAGNOSIS — B372 Candidiasis of skin and nail: Secondary | ICD-10-CM | POA: Diagnosis not present

## 2020-02-18 MED ORDER — METFORMIN HCL 500 MG PO TABS: ORAL_TABLET | ORAL | 3 refills | Status: AC

## 2020-02-18 MED ORDER — CLOTRIMAZOLE 1 % EX CREA: 1.0000 "application " | TOPICAL_CREAM | Freq: Two times a day (BID) | CUTANEOUS | 3 refills | Status: AC

## 2020-02-18 MED ORDER — LEVOTHYROXINE SODIUM 175 MCG PO TABS: 175.0000 ug | ORAL_TABLET | Freq: Every day | ORAL | 3 refills | Status: AC

## 2020-02-18 MED ORDER — MOMETASONE FUROATE 0.1 % EX CREA: 1.0000 "application " | TOPICAL_CREAM | Freq: Every day | CUTANEOUS | 3 refills | Status: AC

## 2020-02-18 MED ORDER — CEPHALEXIN 500 MG PO CAPS
ORAL_CAPSULE | ORAL | 3 refills | Status: AC
Start: 2020-02-18 — End: ?

## 2020-02-18 MED ORDER — FLUCONAZOLE 150 MG PO TABS
ORAL_TABLET | ORAL | 1 refills | Status: AC
Start: 2020-02-18 — End: ?

## 2020-02-18 NOTE — Patient Instructions (Signed)
Take antibiotic once a day - Keflex Use clotrimazole twice a day  and mometasone for spot treatment  Diflucan for yeast  F/U as needed

## 2020-02-18 NOTE — Assessment & Plan Note (Signed)
She has chronic inflammation in her intertriginous zones I believe the steroid use is also thinning out her skin although it does help with some of the symptoms.   We will switch her to clotrimazole topical cream twice a day she can spot treat with the mometasone for intense itching but not Slatter all over the skin.  She has 2 active bacterial infection along with her rosacea/acne so we will restart her on Keflex but will decrease to 500 mg once a day to help prevent bacterial resistance until she can get back in with her dermatologist.  Also give her Diflucan orally to take she can continue the topical zinc oxide powder which works better than nystatin for her

## 2020-02-18 NOTE — Progress Notes (Signed)
Subjective:    Patient ID: Charlotte Carter, female    DOB: 04/27/1975, 44 y.o.   MRN: [id number]  Patient presents for Rash (Pt said that she has some skin irritation around her groin area and her breast. She said that she also needs a refill today with her thyroid meds. She is moving to [facility] soon. )  Pt here to discuss refills on dermatology meds and ongoing rash She has intertrigo beneath the breast beneath her pannus and in the groin region.  She also has history of recurrent boils.  She was on chronic antibiotics because of the boils in her rosacea.  She was last on Keflex due to some insurance changes she has not been able to go back to her dermatologist and has been off her antibiotics for the past few months and her skin has worsened.  She has multiple creams at home, bactroban 2% and triacinolone/nystatin , mometasone,   Triam 0.1% cream continues on.  The mometasone helps the most when she has significant irritation and itching in the intertriginous zones  She is using a zinc oxide/cornstarch powder to help wick up moisture,Caldesence, she also tried the Zabsorb   Due for flu shot today   Levothyroxine needs refilled   Note pt leaving next week to move to [place] ,will get new PCP in Jan   Review Of Systems:  GEN- denies fatigue, fever, weight loss,weakness, recent illness HEENT- denies eye drainage, change in vision, nasal discharge, CVS- denies chest pain, palpitations RESP- denies SOB, cough, wheeze ABD- denies N/V, change in stools, abd pain GU- denies dysuria, hematuria, dribbling, incontinence MSK- denies joint pain, muscle aches, injury Neuro- denies headache, dizziness, syncope, seizure activity       Objective:    BP 120/78   Pulse 68   Resp 12   Ht 5\' 9"  (1.753 m)   Wt 291 lb 3.2 oz (132.1 kg)   SpO2 100%   BMI 43.00 kg/m  GEN- NAD, alert and oriented x3 Neck- Supple, no thyromegaly CVS- RRR, no murmur RESP-CTAB Skin- erythema with plaques  beneath breast R > L, beneath pannus and bila groins, 2 small boils , one on breast and groin, pustular acne on chin with erythema Thinned skin in same regions -shiny appearance  EXT- No edema Pulses- Radial 2+        Assessment & Plan:      Problem List Items Addressed This Visit      Unprioritized   Boils   Relevant Medications   clotrimazole (ANTIFUNGAL CLOTRIMAZOLE) 1 % cream   cephALEXin (KEFLEX) 500 MG capsule   fluconazole (DIFLUCAN) 150 MG tablet   Intertriginous candidiasis - Primary    She has chronic inflammation in her intertriginous zones I believe the steroid use is also thinning out her skin although it does help with some of the symptoms.   We will switch her to clotrimazole topical cream twice a day she can spot treat with the mometasone for intense itching but not Slatter all over the skin.  She has 2 active bacterial infection along with her rosacea/acne so we will restart her on Keflex but will decrease to 500 mg once a day to help prevent bacterial resistance until she can get back in with her dermatologist.  Also give her Diflucan orally to take she can continue the topical zinc oxide powder which works better than nystatin for her       Relevant Medications   clotrimazole (ANTIFUNGAL CLOTRIMAZOLE) 1 % cream  cephALEXin (KEFLEX) 500 MG capsule   fluconazole (DIFLUCAN) 150 MG tablet   Rosacea      Note: This dictation was prepared with Dragon dictation along with smaller phrase technology. Any transcriptional errors that result from this process are unintentional.

## 2020-05-18 ENCOUNTER — Encounter: Payer: Self-pay | Admitting: Family Medicine

## 2020-05-18 ENCOUNTER — Other Ambulatory Visit: Payer: Self-pay | Admitting: Family Medicine

## 2020-05-18 MED ORDER — ALPRAZOLAM 0.5 MG PO TABS: 0.5000 mg | ORAL_TABLET | Freq: Two times a day (BID) | ORAL | 0 refills | Status: AC | PRN

## 2020-05-18 NOTE — Telephone Encounter (Signed)
Ok to refill??  Last office visit 03/07/2020.  Last refill 10/30/2019.

## 2020-05-25 ENCOUNTER — Encounter: Payer: Self-pay | Admitting: Family Medicine

## 2020-11-03 ENCOUNTER — Other Ambulatory Visit: Payer: Self-pay

## 2020-11-04 ENCOUNTER — Other Ambulatory Visit: Payer: Self-pay | Admitting: *Deleted

## 2020-11-04 MED ORDER — ATORVASTATIN CALCIUM 20 MG PO TABS: 20.0000 mg | ORAL_TABLET | Freq: Every day | ORAL | 0 refills | Status: AC
